# Patient Record
Sex: Male | Born: 1950
Health system: Southern US, Community
[De-identification: ages and names within clinical notes are randomized; demographics above are authoritative.]

## PROBLEM LIST (undated history)

## (undated) DIAGNOSIS — E7800 Pure hypercholesterolemia, unspecified: Secondary | ICD-10-CM

## (undated) DIAGNOSIS — I10 Essential (primary) hypertension: Secondary | ICD-10-CM

## (undated) DIAGNOSIS — I2510 Atherosclerotic heart disease of native coronary artery without angina pectoris: Secondary | ICD-10-CM

## (undated) DIAGNOSIS — I4891 Unspecified atrial fibrillation: Secondary | ICD-10-CM

## (undated) DIAGNOSIS — E119 Type 2 diabetes mellitus without complications: Secondary | ICD-10-CM

## (undated) HISTORY — PX: ABLATION OF DYSRHYTHMIC FOCUS: SHX254

## (undated) HISTORY — DX: Atherosclerotic heart disease of native coronary artery without angina pectoris: I25.10

## (undated) HISTORY — DX: Type 2 diabetes mellitus without complications: E11.9

## (undated) HISTORY — DX: Pure hypercholesterolemia, unspecified: E78.00

## (undated) HISTORY — PX: OTHER SURGICAL HISTORY: SHX169

## (undated) HISTORY — PX: CORONARY STENT PLACEMENT: SHX1402

## (undated) HISTORY — DX: Unspecified atrial fibrillation: I48.91

## (undated) HISTORY — DX: Essential (primary) hypertension: I10

---

## 2015-05-10 DIAGNOSIS — I2510 Atherosclerotic heart disease of native coronary artery without angina pectoris: Secondary | ICD-10-CM | POA: Insufficient documentation

## 2015-05-10 HISTORY — DX: Atherosclerotic heart disease of native coronary artery without angina pectoris: I25.10

## 2015-10-31 DIAGNOSIS — D510 Vitamin B12 deficiency anemia due to intrinsic factor deficiency: Secondary | ICD-10-CM | POA: Diagnosis not present

## 2015-11-09 DIAGNOSIS — E113313 Type 2 diabetes mellitus with moderate nonproliferative diabetic retinopathy with macular edema, bilateral: Secondary | ICD-10-CM | POA: Diagnosis not present

## 2015-11-28 DIAGNOSIS — D510 Vitamin B12 deficiency anemia due to intrinsic factor deficiency: Secondary | ICD-10-CM | POA: Diagnosis not present

## 2015-11-30 DIAGNOSIS — E113393 Type 2 diabetes mellitus with moderate nonproliferative diabetic retinopathy without macular edema, bilateral: Secondary | ICD-10-CM | POA: Diagnosis not present

## 2016-01-03 DIAGNOSIS — D510 Vitamin B12 deficiency anemia due to intrinsic factor deficiency: Secondary | ICD-10-CM | POA: Diagnosis not present

## 2016-01-09 DIAGNOSIS — E11610 Type 2 diabetes mellitus with diabetic neuropathic arthropathy: Secondary | ICD-10-CM | POA: Insufficient documentation

## 2016-01-09 DIAGNOSIS — E1165 Type 2 diabetes mellitus with hyperglycemia: Secondary | ICD-10-CM | POA: Diagnosis not present

## 2016-01-09 DIAGNOSIS — I10 Essential (primary) hypertension: Secondary | ICD-10-CM | POA: Insufficient documentation

## 2016-01-09 DIAGNOSIS — Z794 Long term (current) use of insulin: Secondary | ICD-10-CM

## 2016-01-09 DIAGNOSIS — E7800 Pure hypercholesterolemia, unspecified: Secondary | ICD-10-CM | POA: Diagnosis not present

## 2016-01-09 DIAGNOSIS — E1142 Type 2 diabetes mellitus with diabetic polyneuropathy: Secondary | ICD-10-CM | POA: Diagnosis not present

## 2016-01-09 DIAGNOSIS — E1065 Type 1 diabetes mellitus with hyperglycemia: Secondary | ICD-10-CM | POA: Diagnosis not present

## 2016-01-09 DIAGNOSIS — E785 Hyperlipidemia, unspecified: Secondary | ICD-10-CM

## 2016-01-09 DIAGNOSIS — E1042 Type 1 diabetes mellitus with diabetic polyneuropathy: Secondary | ICD-10-CM | POA: Diagnosis not present

## 2016-01-09 HISTORY — DX: Hyperlipidemia, unspecified: E78.5

## 2016-01-09 HISTORY — DX: Long term (current) use of insulin: Z79.4

## 2016-01-09 HISTORY — DX: Type 2 diabetes mellitus with diabetic neuropathic arthropathy: E11.610

## 2016-01-09 HISTORY — DX: Essential (primary) hypertension: I10

## 2016-01-10 DIAGNOSIS — E10610 Type 1 diabetes mellitus with diabetic neuropathic arthropathy: Secondary | ICD-10-CM | POA: Diagnosis not present

## 2016-01-10 DIAGNOSIS — M14671 Charcot's joint, right ankle and foot: Secondary | ICD-10-CM | POA: Diagnosis not present

## 2016-01-10 DIAGNOSIS — E1065 Type 1 diabetes mellitus with hyperglycemia: Secondary | ICD-10-CM | POA: Diagnosis not present

## 2016-01-10 DIAGNOSIS — Z89412 Acquired absence of left great toe: Secondary | ICD-10-CM | POA: Diagnosis not present

## 2016-01-10 DIAGNOSIS — E1049 Type 1 diabetes mellitus with other diabetic neurological complication: Secondary | ICD-10-CM | POA: Diagnosis not present

## 2016-01-10 DIAGNOSIS — M14672 Charcot's joint, left ankle and foot: Secondary | ICD-10-CM | POA: Diagnosis not present

## 2016-01-25 DIAGNOSIS — E113313 Type 2 diabetes mellitus with moderate nonproliferative diabetic retinopathy with macular edema, bilateral: Secondary | ICD-10-CM | POA: Diagnosis not present

## 2016-01-31 DIAGNOSIS — E1065 Type 1 diabetes mellitus with hyperglycemia: Secondary | ICD-10-CM | POA: Diagnosis not present

## 2016-01-31 DIAGNOSIS — E1042 Type 1 diabetes mellitus with diabetic polyneuropathy: Secondary | ICD-10-CM | POA: Diagnosis not present

## 2016-01-31 DIAGNOSIS — Z89412 Acquired absence of left great toe: Secondary | ICD-10-CM

## 2016-01-31 DIAGNOSIS — E10610 Type 1 diabetes mellitus with diabetic neuropathic arthropathy: Secondary | ICD-10-CM | POA: Diagnosis not present

## 2016-01-31 HISTORY — DX: Acquired absence of left great toe: Z89.412

## 2016-02-02 DIAGNOSIS — D510 Vitamin B12 deficiency anemia due to intrinsic factor deficiency: Secondary | ICD-10-CM | POA: Diagnosis not present

## 2016-03-26 DIAGNOSIS — D510 Vitamin B12 deficiency anemia due to intrinsic factor deficiency: Secondary | ICD-10-CM | POA: Diagnosis not present

## 2016-04-10 DIAGNOSIS — E041 Nontoxic single thyroid nodule: Secondary | ICD-10-CM | POA: Insufficient documentation

## 2016-04-10 DIAGNOSIS — E7800 Pure hypercholesterolemia, unspecified: Secondary | ICD-10-CM | POA: Diagnosis not present

## 2016-04-10 DIAGNOSIS — E1165 Type 2 diabetes mellitus with hyperglycemia: Secondary | ICD-10-CM | POA: Diagnosis not present

## 2016-04-10 DIAGNOSIS — E11610 Type 2 diabetes mellitus with diabetic neuropathic arthropathy: Secondary | ICD-10-CM | POA: Diagnosis not present

## 2016-04-10 HISTORY — DX: Nontoxic single thyroid nodule: E04.1

## 2016-05-01 DIAGNOSIS — Z89412 Acquired absence of left great toe: Secondary | ICD-10-CM | POA: Diagnosis not present

## 2016-05-01 DIAGNOSIS — E1140 Type 2 diabetes mellitus with diabetic neuropathy, unspecified: Secondary | ICD-10-CM | POA: Insufficient documentation

## 2016-05-01 DIAGNOSIS — IMO0002 Reserved for concepts with insufficient information to code with codable children: Secondary | ICD-10-CM

## 2016-05-01 DIAGNOSIS — E1165 Type 2 diabetes mellitus with hyperglycemia: Secondary | ICD-10-CM

## 2016-05-01 DIAGNOSIS — E11610 Type 2 diabetes mellitus with diabetic neuropathic arthropathy: Secondary | ICD-10-CM | POA: Diagnosis not present

## 2016-05-01 HISTORY — DX: Reserved for concepts with insufficient information to code with codable children: IMO0002

## 2016-05-01 HISTORY — DX: Type 2 diabetes mellitus with diabetic neuropathy, unspecified: E11.40

## 2016-05-07 DIAGNOSIS — D510 Vitamin B12 deficiency anemia due to intrinsic factor deficiency: Secondary | ICD-10-CM | POA: Diagnosis not present

## 2016-05-09 DIAGNOSIS — E113313 Type 2 diabetes mellitus with moderate nonproliferative diabetic retinopathy with macular edema, bilateral: Secondary | ICD-10-CM | POA: Diagnosis not present

## 2016-05-16 DIAGNOSIS — E113312 Type 2 diabetes mellitus with moderate nonproliferative diabetic retinopathy with macular edema, left eye: Secondary | ICD-10-CM | POA: Diagnosis not present

## 2016-05-20 DIAGNOSIS — Z0000 Encounter for general adult medical examination without abnormal findings: Secondary | ICD-10-CM | POA: Diagnosis not present

## 2016-05-20 DIAGNOSIS — E041 Nontoxic single thyroid nodule: Secondary | ICD-10-CM | POA: Diagnosis not present

## 2016-05-20 DIAGNOSIS — Z1389 Encounter for screening for other disorder: Secondary | ICD-10-CM | POA: Diagnosis not present

## 2016-05-20 DIAGNOSIS — E7800 Pure hypercholesterolemia, unspecified: Secondary | ICD-10-CM | POA: Diagnosis not present

## 2016-05-20 DIAGNOSIS — I10 Essential (primary) hypertension: Secondary | ICD-10-CM | POA: Diagnosis not present

## 2016-05-22 DIAGNOSIS — E041 Nontoxic single thyroid nodule: Secondary | ICD-10-CM | POA: Diagnosis not present

## 2016-06-07 DIAGNOSIS — D510 Vitamin B12 deficiency anemia due to intrinsic factor deficiency: Secondary | ICD-10-CM | POA: Diagnosis not present

## 2016-06-20 DIAGNOSIS — E113313 Type 2 diabetes mellitus with moderate nonproliferative diabetic retinopathy with macular edema, bilateral: Secondary | ICD-10-CM | POA: Diagnosis not present

## 2016-07-11 DIAGNOSIS — E1165 Type 2 diabetes mellitus with hyperglycemia: Secondary | ICD-10-CM | POA: Diagnosis not present

## 2016-07-11 DIAGNOSIS — Z794 Long term (current) use of insulin: Secondary | ICD-10-CM | POA: Diagnosis not present

## 2016-07-11 DIAGNOSIS — E1140 Type 2 diabetes mellitus with diabetic neuropathy, unspecified: Secondary | ICD-10-CM | POA: Diagnosis not present

## 2016-07-11 DIAGNOSIS — E041 Nontoxic single thyroid nodule: Secondary | ICD-10-CM | POA: Diagnosis not present

## 2016-07-11 DIAGNOSIS — I10 Essential (primary) hypertension: Secondary | ICD-10-CM | POA: Diagnosis not present

## 2016-07-12 DIAGNOSIS — D510 Vitamin B12 deficiency anemia due to intrinsic factor deficiency: Secondary | ICD-10-CM | POA: Diagnosis not present

## 2016-07-31 DIAGNOSIS — Z89412 Acquired absence of left great toe: Secondary | ICD-10-CM | POA: Diagnosis not present

## 2016-07-31 DIAGNOSIS — E11610 Type 2 diabetes mellitus with diabetic neuropathic arthropathy: Secondary | ICD-10-CM | POA: Diagnosis not present

## 2016-07-31 DIAGNOSIS — E1165 Type 2 diabetes mellitus with hyperglycemia: Secondary | ICD-10-CM | POA: Diagnosis not present

## 2016-07-31 DIAGNOSIS — E1140 Type 2 diabetes mellitus with diabetic neuropathy, unspecified: Secondary | ICD-10-CM | POA: Diagnosis not present

## 2016-08-15 DIAGNOSIS — J00 Acute nasopharyngitis [common cold]: Secondary | ICD-10-CM | POA: Diagnosis not present

## 2016-09-12 DIAGNOSIS — D510 Vitamin B12 deficiency anemia due to intrinsic factor deficiency: Secondary | ICD-10-CM | POA: Diagnosis not present

## 2016-09-12 DIAGNOSIS — E113313 Type 2 diabetes mellitus with moderate nonproliferative diabetic retinopathy with macular edema, bilateral: Secondary | ICD-10-CM | POA: Diagnosis not present

## 2016-10-10 DIAGNOSIS — D510 Vitamin B12 deficiency anemia due to intrinsic factor deficiency: Secondary | ICD-10-CM | POA: Diagnosis not present

## 2016-10-16 DIAGNOSIS — E7800 Pure hypercholesterolemia, unspecified: Secondary | ICD-10-CM | POA: Diagnosis not present

## 2016-10-16 DIAGNOSIS — E1140 Type 2 diabetes mellitus with diabetic neuropathy, unspecified: Secondary | ICD-10-CM | POA: Diagnosis not present

## 2016-10-16 DIAGNOSIS — E1165 Type 2 diabetes mellitus with hyperglycemia: Secondary | ICD-10-CM | POA: Diagnosis not present

## 2016-10-16 DIAGNOSIS — E041 Nontoxic single thyroid nodule: Secondary | ICD-10-CM | POA: Diagnosis not present

## 2016-11-04 DIAGNOSIS — E785 Hyperlipidemia, unspecified: Secondary | ICD-10-CM | POA: Diagnosis not present

## 2016-11-04 DIAGNOSIS — Z0000 Encounter for general adult medical examination without abnormal findings: Secondary | ICD-10-CM | POA: Diagnosis not present

## 2016-11-04 DIAGNOSIS — D509 Iron deficiency anemia, unspecified: Secondary | ICD-10-CM | POA: Diagnosis not present

## 2016-11-04 DIAGNOSIS — Z125 Encounter for screening for malignant neoplasm of prostate: Secondary | ICD-10-CM | POA: Diagnosis not present

## 2016-11-04 DIAGNOSIS — Z9181 History of falling: Secondary | ICD-10-CM | POA: Diagnosis not present

## 2016-11-04 DIAGNOSIS — Z79899 Other long term (current) drug therapy: Secondary | ICD-10-CM | POA: Diagnosis not present

## 2016-11-06 DIAGNOSIS — E1165 Type 2 diabetes mellitus with hyperglycemia: Secondary | ICD-10-CM | POA: Diagnosis not present

## 2016-11-06 DIAGNOSIS — E11610 Type 2 diabetes mellitus with diabetic neuropathic arthropathy: Secondary | ICD-10-CM | POA: Diagnosis not present

## 2016-11-06 DIAGNOSIS — E1140 Type 2 diabetes mellitus with diabetic neuropathy, unspecified: Secondary | ICD-10-CM | POA: Diagnosis not present

## 2016-11-06 DIAGNOSIS — Z89412 Acquired absence of left great toe: Secondary | ICD-10-CM | POA: Diagnosis not present

## 2016-11-07 DIAGNOSIS — D510 Vitamin B12 deficiency anemia due to intrinsic factor deficiency: Secondary | ICD-10-CM | POA: Diagnosis not present

## 2016-11-07 DIAGNOSIS — E113313 Type 2 diabetes mellitus with moderate nonproliferative diabetic retinopathy with macular edema, bilateral: Secondary | ICD-10-CM | POA: Diagnosis not present

## 2016-11-08 DIAGNOSIS — Z0389 Encounter for observation for other suspected diseases and conditions ruled out: Secondary | ICD-10-CM | POA: Diagnosis not present

## 2016-11-08 DIAGNOSIS — I714 Abdominal aortic aneurysm, without rupture: Secondary | ICD-10-CM | POA: Diagnosis not present

## 2016-11-11 DIAGNOSIS — J209 Acute bronchitis, unspecified: Secondary | ICD-10-CM | POA: Diagnosis not present

## 2016-11-11 DIAGNOSIS — N289 Disorder of kidney and ureter, unspecified: Secondary | ICD-10-CM | POA: Diagnosis not present

## 2016-11-14 DIAGNOSIS — E113313 Type 2 diabetes mellitus with moderate nonproliferative diabetic retinopathy with macular edema, bilateral: Secondary | ICD-10-CM | POA: Diagnosis not present

## 2016-11-19 DIAGNOSIS — I10 Essential (primary) hypertension: Secondary | ICD-10-CM | POA: Diagnosis not present

## 2016-11-19 DIAGNOSIS — E1143 Type 2 diabetes mellitus with diabetic autonomic (poly)neuropathy: Secondary | ICD-10-CM | POA: Diagnosis not present

## 2016-11-19 DIAGNOSIS — I4891 Unspecified atrial fibrillation: Secondary | ICD-10-CM | POA: Diagnosis not present

## 2016-11-26 DIAGNOSIS — I480 Paroxysmal atrial fibrillation: Secondary | ICD-10-CM | POA: Diagnosis not present

## 2016-11-26 DIAGNOSIS — E7800 Pure hypercholesterolemia, unspecified: Secondary | ICD-10-CM | POA: Diagnosis not present

## 2016-11-26 DIAGNOSIS — E0801 Diabetes mellitus due to underlying condition with hyperosmolarity with coma: Secondary | ICD-10-CM | POA: Diagnosis not present

## 2016-11-26 DIAGNOSIS — I10 Essential (primary) hypertension: Secondary | ICD-10-CM | POA: Diagnosis not present

## 2016-11-26 DIAGNOSIS — Z794 Long term (current) use of insulin: Secondary | ICD-10-CM | POA: Diagnosis not present

## 2016-11-26 DIAGNOSIS — I2510 Atherosclerotic heart disease of native coronary artery without angina pectoris: Secondary | ICD-10-CM | POA: Diagnosis not present

## 2016-12-05 DIAGNOSIS — I4891 Unspecified atrial fibrillation: Secondary | ICD-10-CM | POA: Diagnosis not present

## 2016-12-05 DIAGNOSIS — I10 Essential (primary) hypertension: Secondary | ICD-10-CM | POA: Diagnosis not present

## 2016-12-10 DIAGNOSIS — I10 Essential (primary) hypertension: Secondary | ICD-10-CM | POA: Diagnosis not present

## 2016-12-10 DIAGNOSIS — I2510 Atherosclerotic heart disease of native coronary artery without angina pectoris: Secondary | ICD-10-CM | POA: Diagnosis not present

## 2016-12-10 DIAGNOSIS — Z794 Long term (current) use of insulin: Secondary | ICD-10-CM | POA: Diagnosis not present

## 2016-12-10 DIAGNOSIS — E7800 Pure hypercholesterolemia, unspecified: Secondary | ICD-10-CM | POA: Diagnosis not present

## 2016-12-10 DIAGNOSIS — E0801 Diabetes mellitus due to underlying condition with hyperosmolarity with coma: Secondary | ICD-10-CM | POA: Diagnosis not present

## 2016-12-23 DIAGNOSIS — D510 Vitamin B12 deficiency anemia due to intrinsic factor deficiency: Secondary | ICD-10-CM | POA: Diagnosis not present

## 2016-12-25 DIAGNOSIS — I10 Essential (primary) hypertension: Secondary | ICD-10-CM | POA: Diagnosis not present

## 2016-12-25 DIAGNOSIS — E1165 Type 2 diabetes mellitus with hyperglycemia: Secondary | ICD-10-CM | POA: Diagnosis not present

## 2016-12-25 DIAGNOSIS — E7800 Pure hypercholesterolemia, unspecified: Secondary | ICD-10-CM | POA: Diagnosis not present

## 2016-12-25 DIAGNOSIS — I480 Paroxysmal atrial fibrillation: Secondary | ICD-10-CM | POA: Diagnosis not present

## 2016-12-25 DIAGNOSIS — N289 Disorder of kidney and ureter, unspecified: Secondary | ICD-10-CM | POA: Diagnosis not present

## 2016-12-25 DIAGNOSIS — I2510 Atherosclerotic heart disease of native coronary artery without angina pectoris: Secondary | ICD-10-CM | POA: Diagnosis not present

## 2016-12-26 DIAGNOSIS — E113393 Type 2 diabetes mellitus with moderate nonproliferative diabetic retinopathy without macular edema, bilateral: Secondary | ICD-10-CM | POA: Diagnosis not present

## 2017-01-13 DIAGNOSIS — W57XXXA Bitten or stung by nonvenomous insect and other nonvenomous arthropods, initial encounter: Secondary | ICD-10-CM | POA: Diagnosis not present

## 2017-01-13 DIAGNOSIS — Z6827 Body mass index (BMI) 27.0-27.9, adult: Secondary | ICD-10-CM | POA: Diagnosis not present

## 2017-01-13 DIAGNOSIS — R0609 Other forms of dyspnea: Secondary | ICD-10-CM | POA: Diagnosis not present

## 2017-01-13 DIAGNOSIS — J449 Chronic obstructive pulmonary disease, unspecified: Secondary | ICD-10-CM | POA: Diagnosis not present

## 2017-01-13 DIAGNOSIS — R21 Rash and other nonspecific skin eruption: Secondary | ICD-10-CM | POA: Diagnosis not present

## 2017-01-15 DIAGNOSIS — R0789 Other chest pain: Secondary | ICD-10-CM | POA: Diagnosis not present

## 2017-01-15 DIAGNOSIS — I471 Supraventricular tachycardia: Secondary | ICD-10-CM | POA: Diagnosis not present

## 2017-01-15 DIAGNOSIS — I249 Acute ischemic heart disease, unspecified: Secondary | ICD-10-CM | POA: Diagnosis not present

## 2017-01-15 DIAGNOSIS — I5021 Acute systolic (congestive) heart failure: Secondary | ICD-10-CM | POA: Diagnosis not present

## 2017-01-15 DIAGNOSIS — E1122 Type 2 diabetes mellitus with diabetic chronic kidney disease: Secondary | ICD-10-CM | POA: Diagnosis not present

## 2017-01-15 DIAGNOSIS — R000 Tachycardia, unspecified: Secondary | ICD-10-CM | POA: Diagnosis not present

## 2017-01-15 DIAGNOSIS — E119 Type 2 diabetes mellitus without complications: Secondary | ICD-10-CM | POA: Diagnosis not present

## 2017-01-15 DIAGNOSIS — I213 ST elevation (STEMI) myocardial infarction of unspecified site: Secondary | ICD-10-CM | POA: Diagnosis not present

## 2017-01-15 DIAGNOSIS — I480 Paroxysmal atrial fibrillation: Secondary | ICD-10-CM | POA: Diagnosis not present

## 2017-01-15 DIAGNOSIS — F17211 Nicotine dependence, cigarettes, in remission: Secondary | ICD-10-CM | POA: Diagnosis not present

## 2017-01-15 DIAGNOSIS — Z794 Long term (current) use of insulin: Secondary | ICD-10-CM | POA: Diagnosis not present

## 2017-01-15 DIAGNOSIS — D62 Acute posthemorrhagic anemia: Secondary | ICD-10-CM | POA: Diagnosis not present

## 2017-01-15 DIAGNOSIS — E11610 Type 2 diabetes mellitus with diabetic neuropathic arthropathy: Secondary | ICD-10-CM | POA: Diagnosis not present

## 2017-01-15 DIAGNOSIS — Z7982 Long term (current) use of aspirin: Secondary | ICD-10-CM | POA: Diagnosis not present

## 2017-01-15 DIAGNOSIS — I088 Other rheumatic multiple valve diseases: Secondary | ICD-10-CM | POA: Diagnosis not present

## 2017-01-15 DIAGNOSIS — R9431 Abnormal electrocardiogram [ECG] [EKG]: Secondary | ICD-10-CM | POA: Diagnosis not present

## 2017-01-15 DIAGNOSIS — R079 Chest pain, unspecified: Secondary | ICD-10-CM | POA: Diagnosis not present

## 2017-01-15 DIAGNOSIS — I130 Hypertensive heart and chronic kidney disease with heart failure and stage 1 through stage 4 chronic kidney disease, or unspecified chronic kidney disease: Secondary | ICD-10-CM | POA: Diagnosis not present

## 2017-01-15 DIAGNOSIS — I4891 Unspecified atrial fibrillation: Secondary | ICD-10-CM | POA: Diagnosis not present

## 2017-01-15 DIAGNOSIS — I9789 Other postprocedural complications and disorders of the circulatory system, not elsewhere classified: Secondary | ICD-10-CM | POA: Diagnosis not present

## 2017-01-15 DIAGNOSIS — J90 Pleural effusion, not elsewhere classified: Secondary | ICD-10-CM | POA: Diagnosis not present

## 2017-01-15 DIAGNOSIS — Z87891 Personal history of nicotine dependence: Secondary | ICD-10-CM | POA: Diagnosis not present

## 2017-01-15 DIAGNOSIS — E1140 Type 2 diabetes mellitus with diabetic neuropathy, unspecified: Secondary | ICD-10-CM | POA: Diagnosis not present

## 2017-01-15 DIAGNOSIS — I214 Non-ST elevation (NSTEMI) myocardial infarction: Secondary | ICD-10-CM | POA: Diagnosis not present

## 2017-01-15 DIAGNOSIS — J449 Chronic obstructive pulmonary disease, unspecified: Secondary | ICD-10-CM | POA: Diagnosis not present

## 2017-01-15 DIAGNOSIS — E785 Hyperlipidemia, unspecified: Secondary | ICD-10-CM | POA: Diagnosis not present

## 2017-01-15 DIAGNOSIS — I700 Atherosclerosis of aorta: Secondary | ICD-10-CM | POA: Diagnosis not present

## 2017-01-15 DIAGNOSIS — R0902 Hypoxemia: Secondary | ICD-10-CM | POA: Diagnosis not present

## 2017-01-15 DIAGNOSIS — F05 Delirium due to known physiological condition: Secondary | ICD-10-CM | POA: Diagnosis not present

## 2017-01-15 DIAGNOSIS — N390 Urinary tract infection, site not specified: Secondary | ICD-10-CM | POA: Diagnosis not present

## 2017-01-15 DIAGNOSIS — I2510 Atherosclerotic heart disease of native coronary artery without angina pectoris: Secondary | ICD-10-CM | POA: Diagnosis not present

## 2017-01-15 DIAGNOSIS — I119 Hypertensive heart disease without heart failure: Secondary | ICD-10-CM | POA: Diagnosis not present

## 2017-01-15 DIAGNOSIS — Z8249 Family history of ischemic heart disease and other diseases of the circulatory system: Secondary | ICD-10-CM | POA: Diagnosis not present

## 2017-01-15 DIAGNOSIS — I25118 Atherosclerotic heart disease of native coronary artery with other forms of angina pectoris: Secondary | ICD-10-CM | POA: Diagnosis not present

## 2017-01-15 DIAGNOSIS — Z955 Presence of coronary angioplasty implant and graft: Secondary | ICD-10-CM | POA: Diagnosis not present

## 2017-01-15 DIAGNOSIS — E7800 Pure hypercholesterolemia, unspecified: Secondary | ICD-10-CM | POA: Diagnosis not present

## 2017-01-15 DIAGNOSIS — I252 Old myocardial infarction: Secondary | ICD-10-CM | POA: Diagnosis not present

## 2017-01-15 DIAGNOSIS — N170 Acute kidney failure with tubular necrosis: Secondary | ICD-10-CM | POA: Diagnosis not present

## 2017-01-15 DIAGNOSIS — I2582 Chronic total occlusion of coronary artery: Secondary | ICD-10-CM | POA: Diagnosis not present

## 2017-01-16 DIAGNOSIS — Z87891 Personal history of nicotine dependence: Secondary | ICD-10-CM | POA: Diagnosis not present

## 2017-01-16 DIAGNOSIS — I119 Hypertensive heart disease without heart failure: Secondary | ICD-10-CM | POA: Diagnosis not present

## 2017-01-16 DIAGNOSIS — I214 Non-ST elevation (NSTEMI) myocardial infarction: Secondary | ICD-10-CM | POA: Diagnosis not present

## 2017-01-16 DIAGNOSIS — I10 Essential (primary) hypertension: Secondary | ICD-10-CM | POA: Diagnosis not present

## 2017-01-16 DIAGNOSIS — E784 Other hyperlipidemia: Secondary | ICD-10-CM | POA: Diagnosis not present

## 2017-01-16 DIAGNOSIS — I25118 Atherosclerotic heart disease of native coronary artery with other forms of angina pectoris: Secondary | ICD-10-CM | POA: Diagnosis not present

## 2017-01-16 DIAGNOSIS — I25110 Atherosclerotic heart disease of native coronary artery with unstable angina pectoris: Secondary | ICD-10-CM | POA: Diagnosis not present

## 2017-01-16 DIAGNOSIS — Z79899 Other long term (current) drug therapy: Secondary | ICD-10-CM | POA: Diagnosis not present

## 2017-01-16 DIAGNOSIS — I4891 Unspecified atrial fibrillation: Secondary | ICD-10-CM | POA: Diagnosis not present

## 2017-01-16 DIAGNOSIS — Z794 Long term (current) use of insulin: Secondary | ICD-10-CM | POA: Diagnosis not present

## 2017-01-16 DIAGNOSIS — E119 Type 2 diabetes mellitus without complications: Secondary | ICD-10-CM | POA: Diagnosis not present

## 2017-01-17 DIAGNOSIS — I214 Non-ST elevation (NSTEMI) myocardial infarction: Secondary | ICD-10-CM | POA: Diagnosis not present

## 2017-01-17 DIAGNOSIS — I10 Essential (primary) hypertension: Secondary | ICD-10-CM | POA: Diagnosis not present

## 2017-01-17 DIAGNOSIS — E119 Type 2 diabetes mellitus without complications: Secondary | ICD-10-CM | POA: Diagnosis not present

## 2017-01-17 DIAGNOSIS — I2510 Atherosclerotic heart disease of native coronary artery without angina pectoris: Secondary | ICD-10-CM | POA: Diagnosis not present

## 2017-01-17 DIAGNOSIS — I4891 Unspecified atrial fibrillation: Secondary | ICD-10-CM | POA: Diagnosis not present

## 2017-01-17 DIAGNOSIS — E785 Hyperlipidemia, unspecified: Secondary | ICD-10-CM | POA: Diagnosis not present

## 2017-01-18 DIAGNOSIS — I2510 Atherosclerotic heart disease of native coronary artery without angina pectoris: Secondary | ICD-10-CM | POA: Diagnosis not present

## 2017-01-18 DIAGNOSIS — E785 Hyperlipidemia, unspecified: Secondary | ICD-10-CM | POA: Diagnosis not present

## 2017-01-18 DIAGNOSIS — I214 Non-ST elevation (NSTEMI) myocardial infarction: Secondary | ICD-10-CM | POA: Diagnosis not present

## 2017-01-18 DIAGNOSIS — I4891 Unspecified atrial fibrillation: Secondary | ICD-10-CM | POA: Diagnosis not present

## 2017-01-18 DIAGNOSIS — I10 Essential (primary) hypertension: Secondary | ICD-10-CM | POA: Diagnosis not present

## 2017-01-18 DIAGNOSIS — E119 Type 2 diabetes mellitus without complications: Secondary | ICD-10-CM | POA: Diagnosis not present

## 2017-01-19 DIAGNOSIS — E119 Type 2 diabetes mellitus without complications: Secondary | ICD-10-CM | POA: Diagnosis not present

## 2017-01-19 DIAGNOSIS — I2510 Atherosclerotic heart disease of native coronary artery without angina pectoris: Secondary | ICD-10-CM | POA: Diagnosis not present

## 2017-01-19 DIAGNOSIS — I25118 Atherosclerotic heart disease of native coronary artery with other forms of angina pectoris: Secondary | ICD-10-CM | POA: Diagnosis not present

## 2017-01-19 DIAGNOSIS — I119 Hypertensive heart disease without heart failure: Secondary | ICD-10-CM | POA: Diagnosis not present

## 2017-01-19 DIAGNOSIS — I214 Non-ST elevation (NSTEMI) myocardial infarction: Secondary | ICD-10-CM | POA: Diagnosis not present

## 2017-01-19 DIAGNOSIS — E784 Other hyperlipidemia: Secondary | ICD-10-CM | POA: Diagnosis not present

## 2017-01-19 DIAGNOSIS — I4891 Unspecified atrial fibrillation: Secondary | ICD-10-CM | POA: Diagnosis not present

## 2017-01-19 DIAGNOSIS — Z79899 Other long term (current) drug therapy: Secondary | ICD-10-CM | POA: Diagnosis not present

## 2017-01-19 DIAGNOSIS — R001 Bradycardia, unspecified: Secondary | ICD-10-CM | POA: Diagnosis not present

## 2017-01-20 DIAGNOSIS — I480 Paroxysmal atrial fibrillation: Secondary | ICD-10-CM | POA: Diagnosis not present

## 2017-01-20 DIAGNOSIS — I5020 Unspecified systolic (congestive) heart failure: Secondary | ICD-10-CM | POA: Diagnosis not present

## 2017-01-20 DIAGNOSIS — E119 Type 2 diabetes mellitus without complications: Secondary | ICD-10-CM | POA: Diagnosis not present

## 2017-01-20 DIAGNOSIS — I110 Hypertensive heart disease with heart failure: Secondary | ICD-10-CM | POA: Diagnosis not present

## 2017-01-20 DIAGNOSIS — I2510 Atherosclerotic heart disease of native coronary artery without angina pectoris: Secondary | ICD-10-CM | POA: Diagnosis not present

## 2017-01-20 DIAGNOSIS — E784 Other hyperlipidemia: Secondary | ICD-10-CM | POA: Diagnosis not present

## 2017-01-21 DIAGNOSIS — I214 Non-ST elevation (NSTEMI) myocardial infarction: Secondary | ICD-10-CM | POA: Diagnosis not present

## 2017-01-21 DIAGNOSIS — J40 Bronchitis, not specified as acute or chronic: Secondary | ICD-10-CM | POA: Diagnosis not present

## 2017-01-21 DIAGNOSIS — I25118 Atherosclerotic heart disease of native coronary artery with other forms of angina pectoris: Secondary | ICD-10-CM | POA: Diagnosis not present

## 2017-01-21 DIAGNOSIS — I509 Heart failure, unspecified: Secondary | ICD-10-CM | POA: Diagnosis not present

## 2017-01-21 DIAGNOSIS — I110 Hypertensive heart disease with heart failure: Secondary | ICD-10-CM | POA: Diagnosis not present

## 2017-01-21 DIAGNOSIS — I371 Nonrheumatic pulmonary valve insufficiency: Secondary | ICD-10-CM | POA: Diagnosis not present

## 2017-01-21 DIAGNOSIS — I480 Paroxysmal atrial fibrillation: Secondary | ICD-10-CM | POA: Diagnosis not present

## 2017-01-21 DIAGNOSIS — E119 Type 2 diabetes mellitus without complications: Secondary | ICD-10-CM | POA: Diagnosis not present

## 2017-01-21 DIAGNOSIS — I5020 Unspecified systolic (congestive) heart failure: Secondary | ICD-10-CM | POA: Diagnosis not present

## 2017-01-21 DIAGNOSIS — E784 Other hyperlipidemia: Secondary | ICD-10-CM | POA: Diagnosis not present

## 2017-01-21 DIAGNOSIS — J90 Pleural effusion, not elsewhere classified: Secondary | ICD-10-CM | POA: Diagnosis not present

## 2017-01-21 DIAGNOSIS — I517 Cardiomegaly: Secondary | ICD-10-CM | POA: Diagnosis not present

## 2017-01-21 DIAGNOSIS — I340 Nonrheumatic mitral (valve) insufficiency: Secondary | ICD-10-CM | POA: Diagnosis not present

## 2017-01-22 DIAGNOSIS — I25110 Atherosclerotic heart disease of native coronary artery with unstable angina pectoris: Secondary | ICD-10-CM | POA: Diagnosis not present

## 2017-01-22 DIAGNOSIS — I2510 Atherosclerotic heart disease of native coronary artery without angina pectoris: Secondary | ICD-10-CM | POA: Diagnosis not present

## 2017-01-22 DIAGNOSIS — R918 Other nonspecific abnormal finding of lung field: Secondary | ICD-10-CM | POA: Diagnosis not present

## 2017-01-22 DIAGNOSIS — I214 Non-ST elevation (NSTEMI) myocardial infarction: Secondary | ICD-10-CM | POA: Diagnosis not present

## 2017-01-23 DIAGNOSIS — I25118 Atherosclerotic heart disease of native coronary artery with other forms of angina pectoris: Secondary | ICD-10-CM | POA: Diagnosis not present

## 2017-01-23 DIAGNOSIS — I10 Essential (primary) hypertension: Secondary | ICD-10-CM | POA: Diagnosis not present

## 2017-01-23 DIAGNOSIS — I25119 Atherosclerotic heart disease of native coronary artery with unspecified angina pectoris: Secondary | ICD-10-CM | POA: Diagnosis not present

## 2017-01-23 DIAGNOSIS — E784 Other hyperlipidemia: Secondary | ICD-10-CM | POA: Diagnosis not present

## 2017-01-23 DIAGNOSIS — D649 Anemia, unspecified: Secondary | ICD-10-CM | POA: Diagnosis not present

## 2017-01-23 DIAGNOSIS — I214 Non-ST elevation (NSTEMI) myocardial infarction: Secondary | ICD-10-CM | POA: Diagnosis not present

## 2017-01-23 DIAGNOSIS — N390 Urinary tract infection, site not specified: Secondary | ICD-10-CM | POA: Diagnosis not present

## 2017-01-23 DIAGNOSIS — Z951 Presence of aortocoronary bypass graft: Secondary | ICD-10-CM | POA: Diagnosis not present

## 2017-01-23 DIAGNOSIS — E119 Type 2 diabetes mellitus without complications: Secondary | ICD-10-CM | POA: Diagnosis not present

## 2017-01-23 DIAGNOSIS — I119 Hypertensive heart disease without heart failure: Secondary | ICD-10-CM | POA: Diagnosis not present

## 2017-01-23 DIAGNOSIS — R918 Other nonspecific abnormal finding of lung field: Secondary | ICD-10-CM | POA: Diagnosis not present

## 2017-01-23 DIAGNOSIS — N179 Acute kidney failure, unspecified: Secondary | ICD-10-CM | POA: Diagnosis not present

## 2017-01-23 DIAGNOSIS — I4891 Unspecified atrial fibrillation: Secondary | ICD-10-CM | POA: Diagnosis not present

## 2017-01-23 DIAGNOSIS — E1169 Type 2 diabetes mellitus with other specified complication: Secondary | ICD-10-CM | POA: Diagnosis not present

## 2017-01-24 DIAGNOSIS — D649 Anemia, unspecified: Secondary | ICD-10-CM | POA: Diagnosis not present

## 2017-01-24 DIAGNOSIS — J9811 Atelectasis: Secondary | ICD-10-CM | POA: Diagnosis not present

## 2017-01-24 DIAGNOSIS — I10 Essential (primary) hypertension: Secondary | ICD-10-CM | POA: Diagnosis not present

## 2017-01-24 DIAGNOSIS — Z79899 Other long term (current) drug therapy: Secondary | ICD-10-CM | POA: Diagnosis not present

## 2017-01-24 DIAGNOSIS — I25119 Atherosclerotic heart disease of native coronary artery with unspecified angina pectoris: Secondary | ICD-10-CM | POA: Diagnosis not present

## 2017-01-24 DIAGNOSIS — E784 Other hyperlipidemia: Secondary | ICD-10-CM | POA: Diagnosis not present

## 2017-01-24 DIAGNOSIS — E119 Type 2 diabetes mellitus without complications: Secondary | ICD-10-CM | POA: Diagnosis not present

## 2017-01-24 DIAGNOSIS — E1169 Type 2 diabetes mellitus with other specified complication: Secondary | ICD-10-CM | POA: Diagnosis not present

## 2017-01-24 DIAGNOSIS — I119 Hypertensive heart disease without heart failure: Secondary | ICD-10-CM | POA: Diagnosis not present

## 2017-01-24 DIAGNOSIS — I214 Non-ST elevation (NSTEMI) myocardial infarction: Secondary | ICD-10-CM | POA: Diagnosis not present

## 2017-01-24 DIAGNOSIS — I4891 Unspecified atrial fibrillation: Secondary | ICD-10-CM | POA: Diagnosis not present

## 2017-01-24 DIAGNOSIS — I25708 Atherosclerosis of coronary artery bypass graft(s), unspecified, with other forms of angina pectoris: Secondary | ICD-10-CM | POA: Diagnosis not present

## 2017-01-24 DIAGNOSIS — N179 Acute kidney failure, unspecified: Secondary | ICD-10-CM | POA: Diagnosis not present

## 2017-01-24 DIAGNOSIS — N390 Urinary tract infection, site not specified: Secondary | ICD-10-CM | POA: Diagnosis not present

## 2017-01-25 DIAGNOSIS — I214 Non-ST elevation (NSTEMI) myocardial infarction: Secondary | ICD-10-CM | POA: Diagnosis not present

## 2017-01-25 DIAGNOSIS — I4891 Unspecified atrial fibrillation: Secondary | ICD-10-CM | POA: Diagnosis not present

## 2017-01-25 DIAGNOSIS — N390 Urinary tract infection, site not specified: Secondary | ICD-10-CM | POA: Diagnosis not present

## 2017-01-25 DIAGNOSIS — I10 Essential (primary) hypertension: Secondary | ICD-10-CM | POA: Diagnosis not present

## 2017-01-25 DIAGNOSIS — I25118 Atherosclerotic heart disease of native coronary artery with other forms of angina pectoris: Secondary | ICD-10-CM | POA: Diagnosis not present

## 2017-01-25 DIAGNOSIS — N179 Acute kidney failure, unspecified: Secondary | ICD-10-CM | POA: Diagnosis not present

## 2017-01-25 DIAGNOSIS — I119 Hypertensive heart disease without heart failure: Secondary | ICD-10-CM | POA: Diagnosis not present

## 2017-01-25 DIAGNOSIS — Z79899 Other long term (current) drug therapy: Secondary | ICD-10-CM | POA: Diagnosis not present

## 2017-01-25 DIAGNOSIS — E119 Type 2 diabetes mellitus without complications: Secondary | ICD-10-CM | POA: Diagnosis not present

## 2017-01-25 DIAGNOSIS — R918 Other nonspecific abnormal finding of lung field: Secondary | ICD-10-CM | POA: Diagnosis not present

## 2017-01-25 DIAGNOSIS — I25119 Atherosclerotic heart disease of native coronary artery with unspecified angina pectoris: Secondary | ICD-10-CM | POA: Diagnosis not present

## 2017-01-25 DIAGNOSIS — D649 Anemia, unspecified: Secondary | ICD-10-CM | POA: Diagnosis not present

## 2017-01-25 DIAGNOSIS — I481 Persistent atrial fibrillation: Secondary | ICD-10-CM | POA: Diagnosis not present

## 2017-01-25 DIAGNOSIS — E784 Other hyperlipidemia: Secondary | ICD-10-CM | POA: Diagnosis not present

## 2017-01-25 DIAGNOSIS — E1169 Type 2 diabetes mellitus with other specified complication: Secondary | ICD-10-CM | POA: Diagnosis not present

## 2017-01-25 DIAGNOSIS — I517 Cardiomegaly: Secondary | ICD-10-CM | POA: Diagnosis not present

## 2017-01-26 DIAGNOSIS — E1169 Type 2 diabetes mellitus with other specified complication: Secondary | ICD-10-CM | POA: Diagnosis not present

## 2017-01-26 DIAGNOSIS — G4751 Confusional arousals: Secondary | ICD-10-CM | POA: Diagnosis not present

## 2017-01-26 DIAGNOSIS — E784 Other hyperlipidemia: Secondary | ICD-10-CM | POA: Diagnosis not present

## 2017-01-26 DIAGNOSIS — I119 Hypertensive heart disease without heart failure: Secondary | ICD-10-CM | POA: Diagnosis not present

## 2017-01-26 DIAGNOSIS — N179 Acute kidney failure, unspecified: Secondary | ICD-10-CM | POA: Diagnosis not present

## 2017-01-26 DIAGNOSIS — J90 Pleural effusion, not elsewhere classified: Secondary | ICD-10-CM | POA: Diagnosis not present

## 2017-01-26 DIAGNOSIS — I10 Essential (primary) hypertension: Secondary | ICD-10-CM | POA: Diagnosis not present

## 2017-01-26 DIAGNOSIS — I25708 Atherosclerosis of coronary artery bypass graft(s), unspecified, with other forms of angina pectoris: Secondary | ICD-10-CM | POA: Diagnosis not present

## 2017-01-26 DIAGNOSIS — N390 Urinary tract infection, site not specified: Secondary | ICD-10-CM | POA: Diagnosis not present

## 2017-01-26 DIAGNOSIS — D649 Anemia, unspecified: Secondary | ICD-10-CM | POA: Diagnosis not present

## 2017-01-26 DIAGNOSIS — I4891 Unspecified atrial fibrillation: Secondary | ICD-10-CM | POA: Diagnosis not present

## 2017-01-26 DIAGNOSIS — R4182 Altered mental status, unspecified: Secondary | ICD-10-CM | POA: Diagnosis not present

## 2017-01-26 DIAGNOSIS — I214 Non-ST elevation (NSTEMI) myocardial infarction: Secondary | ICD-10-CM | POA: Diagnosis not present

## 2017-01-26 DIAGNOSIS — E119 Type 2 diabetes mellitus without complications: Secondary | ICD-10-CM | POA: Diagnosis not present

## 2017-01-26 DIAGNOSIS — I480 Paroxysmal atrial fibrillation: Secondary | ICD-10-CM | POA: Diagnosis not present

## 2017-01-26 DIAGNOSIS — I25119 Atherosclerotic heart disease of native coronary artery with unspecified angina pectoris: Secondary | ICD-10-CM | POA: Diagnosis not present

## 2017-01-27 DIAGNOSIS — G4751 Confusional arousals: Secondary | ICD-10-CM | POA: Diagnosis not present

## 2017-01-27 DIAGNOSIS — I25708 Atherosclerosis of coronary artery bypass graft(s), unspecified, with other forms of angina pectoris: Secondary | ICD-10-CM | POA: Diagnosis not present

## 2017-01-27 DIAGNOSIS — E1169 Type 2 diabetes mellitus with other specified complication: Secondary | ICD-10-CM | POA: Diagnosis not present

## 2017-01-27 DIAGNOSIS — I4891 Unspecified atrial fibrillation: Secondary | ICD-10-CM | POA: Diagnosis not present

## 2017-01-27 DIAGNOSIS — Z79899 Other long term (current) drug therapy: Secondary | ICD-10-CM | POA: Diagnosis not present

## 2017-01-27 DIAGNOSIS — R0600 Dyspnea, unspecified: Secondary | ICD-10-CM | POA: Diagnosis not present

## 2017-01-27 DIAGNOSIS — N179 Acute kidney failure, unspecified: Secondary | ICD-10-CM | POA: Diagnosis not present

## 2017-01-27 DIAGNOSIS — E784 Other hyperlipidemia: Secondary | ICD-10-CM | POA: Diagnosis not present

## 2017-01-27 DIAGNOSIS — I119 Hypertensive heart disease without heart failure: Secondary | ICD-10-CM | POA: Diagnosis not present

## 2017-01-27 DIAGNOSIS — Z452 Encounter for adjustment and management of vascular access device: Secondary | ICD-10-CM | POA: Diagnosis not present

## 2017-01-27 DIAGNOSIS — I25119 Atherosclerotic heart disease of native coronary artery with unspecified angina pectoris: Secondary | ICD-10-CM | POA: Diagnosis not present

## 2017-01-27 DIAGNOSIS — I10 Essential (primary) hypertension: Secondary | ICD-10-CM | POA: Diagnosis not present

## 2017-01-27 DIAGNOSIS — I214 Non-ST elevation (NSTEMI) myocardial infarction: Secondary | ICD-10-CM | POA: Diagnosis not present

## 2017-01-27 DIAGNOSIS — E119 Type 2 diabetes mellitus without complications: Secondary | ICD-10-CM | POA: Diagnosis not present

## 2017-01-28 DIAGNOSIS — I214 Non-ST elevation (NSTEMI) myocardial infarction: Secondary | ICD-10-CM | POA: Diagnosis not present

## 2017-01-28 DIAGNOSIS — N179 Acute kidney failure, unspecified: Secondary | ICD-10-CM | POA: Diagnosis not present

## 2017-01-28 DIAGNOSIS — I25119 Atherosclerotic heart disease of native coronary artery with unspecified angina pectoris: Secondary | ICD-10-CM | POA: Diagnosis not present

## 2017-01-28 DIAGNOSIS — I10 Essential (primary) hypertension: Secondary | ICD-10-CM | POA: Diagnosis not present

## 2017-01-28 DIAGNOSIS — I110 Hypertensive heart disease with heart failure: Secondary | ICD-10-CM | POA: Diagnosis not present

## 2017-01-28 DIAGNOSIS — I25708 Atherosclerosis of coronary artery bypass graft(s), unspecified, with other forms of angina pectoris: Secondary | ICD-10-CM | POA: Diagnosis not present

## 2017-01-28 DIAGNOSIS — I480 Paroxysmal atrial fibrillation: Secondary | ICD-10-CM | POA: Diagnosis not present

## 2017-01-28 DIAGNOSIS — I5021 Acute systolic (congestive) heart failure: Secondary | ICD-10-CM | POA: Diagnosis not present

## 2017-01-28 DIAGNOSIS — E784 Other hyperlipidemia: Secondary | ICD-10-CM | POA: Diagnosis not present

## 2017-01-28 DIAGNOSIS — E119 Type 2 diabetes mellitus without complications: Secondary | ICD-10-CM | POA: Diagnosis not present

## 2017-01-28 DIAGNOSIS — E8779 Other fluid overload: Secondary | ICD-10-CM | POA: Diagnosis not present

## 2017-01-28 DIAGNOSIS — Z7901 Long term (current) use of anticoagulants: Secondary | ICD-10-CM | POA: Diagnosis not present

## 2017-01-29 DIAGNOSIS — I214 Non-ST elevation (NSTEMI) myocardial infarction: Secondary | ICD-10-CM | POA: Diagnosis not present

## 2017-01-29 DIAGNOSIS — N179 Acute kidney failure, unspecified: Secondary | ICD-10-CM | POA: Diagnosis not present

## 2017-01-29 DIAGNOSIS — J9811 Atelectasis: Secondary | ICD-10-CM | POA: Diagnosis not present

## 2017-01-29 DIAGNOSIS — E8779 Other fluid overload: Secondary | ICD-10-CM | POA: Diagnosis not present

## 2017-01-29 DIAGNOSIS — I25119 Atherosclerotic heart disease of native coronary artery with unspecified angina pectoris: Secondary | ICD-10-CM | POA: Diagnosis not present

## 2017-01-29 DIAGNOSIS — J90 Pleural effusion, not elsewhere classified: Secondary | ICD-10-CM | POA: Diagnosis not present

## 2017-01-29 DIAGNOSIS — I4891 Unspecified atrial fibrillation: Secondary | ICD-10-CM | POA: Diagnosis not present

## 2017-01-29 DIAGNOSIS — I2510 Atherosclerotic heart disease of native coronary artery without angina pectoris: Secondary | ICD-10-CM | POA: Diagnosis not present

## 2017-01-29 DIAGNOSIS — I10 Essential (primary) hypertension: Secondary | ICD-10-CM | POA: Diagnosis not present

## 2017-01-29 DIAGNOSIS — Z951 Presence of aortocoronary bypass graft: Secondary | ICD-10-CM | POA: Diagnosis not present

## 2017-01-30 DIAGNOSIS — N179 Acute kidney failure, unspecified: Secondary | ICD-10-CM | POA: Diagnosis not present

## 2017-01-30 DIAGNOSIS — I25119 Atherosclerotic heart disease of native coronary artery with unspecified angina pectoris: Secondary | ICD-10-CM | POA: Diagnosis not present

## 2017-01-30 DIAGNOSIS — I480 Paroxysmal atrial fibrillation: Secondary | ICD-10-CM | POA: Diagnosis not present

## 2017-01-30 DIAGNOSIS — I10 Essential (primary) hypertension: Secondary | ICD-10-CM | POA: Diagnosis not present

## 2017-01-30 DIAGNOSIS — I214 Non-ST elevation (NSTEMI) myocardial infarction: Secondary | ICD-10-CM | POA: Diagnosis not present

## 2017-01-30 DIAGNOSIS — I2510 Atherosclerotic heart disease of native coronary artery without angina pectoris: Secondary | ICD-10-CM | POA: Diagnosis not present

## 2017-01-30 DIAGNOSIS — J9811 Atelectasis: Secondary | ICD-10-CM | POA: Diagnosis not present

## 2017-01-30 DIAGNOSIS — E8779 Other fluid overload: Secondary | ICD-10-CM | POA: Diagnosis not present

## 2017-01-30 DIAGNOSIS — J90 Pleural effusion, not elsewhere classified: Secondary | ICD-10-CM | POA: Diagnosis not present

## 2017-01-30 DIAGNOSIS — Z951 Presence of aortocoronary bypass graft: Secondary | ICD-10-CM | POA: Diagnosis not present

## 2017-01-31 DIAGNOSIS — Z951 Presence of aortocoronary bypass graft: Secondary | ICD-10-CM | POA: Diagnosis not present

## 2017-01-31 DIAGNOSIS — I2510 Atherosclerotic heart disease of native coronary artery without angina pectoris: Secondary | ICD-10-CM | POA: Diagnosis not present

## 2017-01-31 DIAGNOSIS — I480 Paroxysmal atrial fibrillation: Secondary | ICD-10-CM | POA: Diagnosis not present

## 2017-01-31 DIAGNOSIS — I214 Non-ST elevation (NSTEMI) myocardial infarction: Secondary | ICD-10-CM | POA: Diagnosis not present

## 2017-02-01 DIAGNOSIS — I214 Non-ST elevation (NSTEMI) myocardial infarction: Secondary | ICD-10-CM | POA: Diagnosis not present

## 2017-02-01 DIAGNOSIS — J90 Pleural effusion, not elsewhere classified: Secondary | ICD-10-CM | POA: Diagnosis not present

## 2017-02-01 DIAGNOSIS — E784 Other hyperlipidemia: Secondary | ICD-10-CM | POA: Diagnosis not present

## 2017-02-01 DIAGNOSIS — I1310 Hypertensive heart and chronic kidney disease without heart failure, with stage 1 through stage 4 chronic kidney disease, or unspecified chronic kidney disease: Secondary | ICD-10-CM | POA: Diagnosis not present

## 2017-02-01 DIAGNOSIS — N183 Chronic kidney disease, stage 3 (moderate): Secondary | ICD-10-CM | POA: Diagnosis not present

## 2017-02-01 DIAGNOSIS — I25708 Atherosclerosis of coronary artery bypass graft(s), unspecified, with other forms of angina pectoris: Secondary | ICD-10-CM | POA: Diagnosis not present

## 2017-02-01 DIAGNOSIS — I480 Paroxysmal atrial fibrillation: Secondary | ICD-10-CM | POA: Diagnosis not present

## 2017-02-02 DIAGNOSIS — I214 Non-ST elevation (NSTEMI) myocardial infarction: Secondary | ICD-10-CM | POA: Diagnosis not present

## 2017-02-02 DIAGNOSIS — N183 Chronic kidney disease, stage 3 (moderate): Secondary | ICD-10-CM | POA: Diagnosis not present

## 2017-02-02 DIAGNOSIS — Z6828 Body mass index (BMI) 28.0-28.9, adult: Secondary | ICD-10-CM | POA: Diagnosis not present

## 2017-02-02 DIAGNOSIS — J9811 Atelectasis: Secondary | ICD-10-CM | POA: Diagnosis not present

## 2017-02-02 DIAGNOSIS — I1310 Hypertensive heart and chronic kidney disease without heart failure, with stage 1 through stage 4 chronic kidney disease, or unspecified chronic kidney disease: Secondary | ICD-10-CM | POA: Diagnosis not present

## 2017-02-02 DIAGNOSIS — J90 Pleural effusion, not elsewhere classified: Secondary | ICD-10-CM | POA: Diagnosis not present

## 2017-02-02 DIAGNOSIS — E784 Other hyperlipidemia: Secondary | ICD-10-CM | POA: Diagnosis not present

## 2017-02-02 DIAGNOSIS — I25708 Atherosclerosis of coronary artery bypass graft(s), unspecified, with other forms of angina pectoris: Secondary | ICD-10-CM | POA: Diagnosis not present

## 2017-02-02 DIAGNOSIS — I480 Paroxysmal atrial fibrillation: Secondary | ICD-10-CM | POA: Diagnosis not present

## 2017-02-03 DIAGNOSIS — Z23 Encounter for immunization: Secondary | ICD-10-CM | POA: Diagnosis not present

## 2017-02-03 DIAGNOSIS — J90 Pleural effusion, not elsewhere classified: Secondary | ICD-10-CM | POA: Diagnosis not present

## 2017-02-03 DIAGNOSIS — I119 Hypertensive heart disease without heart failure: Secondary | ICD-10-CM | POA: Diagnosis not present

## 2017-02-03 DIAGNOSIS — J449 Chronic obstructive pulmonary disease, unspecified: Secondary | ICD-10-CM | POA: Diagnosis not present

## 2017-02-03 DIAGNOSIS — D62 Acute posthemorrhagic anemia: Secondary | ICD-10-CM | POA: Diagnosis not present

## 2017-02-03 DIAGNOSIS — N189 Chronic kidney disease, unspecified: Secondary | ICD-10-CM | POA: Diagnosis not present

## 2017-02-03 DIAGNOSIS — E1140 Type 2 diabetes mellitus with diabetic neuropathy, unspecified: Secondary | ICD-10-CM | POA: Diagnosis not present

## 2017-02-03 DIAGNOSIS — I480 Paroxysmal atrial fibrillation: Secondary | ICD-10-CM | POA: Diagnosis not present

## 2017-02-03 DIAGNOSIS — I214 Non-ST elevation (NSTEMI) myocardial infarction: Secondary | ICD-10-CM | POA: Diagnosis not present

## 2017-02-03 DIAGNOSIS — E876 Hypokalemia: Secondary | ICD-10-CM | POA: Diagnosis not present

## 2017-02-03 DIAGNOSIS — N179 Acute kidney failure, unspecified: Secondary | ICD-10-CM | POA: Diagnosis not present

## 2017-02-03 DIAGNOSIS — E785 Hyperlipidemia, unspecified: Secondary | ICD-10-CM | POA: Diagnosis not present

## 2017-02-03 DIAGNOSIS — Z955 Presence of coronary angioplasty implant and graft: Secondary | ICD-10-CM | POA: Diagnosis not present

## 2017-02-03 DIAGNOSIS — E784 Other hyperlipidemia: Secondary | ICD-10-CM | POA: Diagnosis not present

## 2017-02-03 DIAGNOSIS — E119 Type 2 diabetes mellitus without complications: Secondary | ICD-10-CM | POA: Diagnosis not present

## 2017-02-03 DIAGNOSIS — Z951 Presence of aortocoronary bypass graft: Secondary | ICD-10-CM | POA: Diagnosis not present

## 2017-02-03 DIAGNOSIS — E7800 Pure hypercholesterolemia, unspecified: Secondary | ICD-10-CM | POA: Diagnosis not present

## 2017-02-03 DIAGNOSIS — Z7901 Long term (current) use of anticoagulants: Secondary | ICD-10-CM | POA: Diagnosis not present

## 2017-02-03 DIAGNOSIS — F05 Delirium due to known physiological condition: Secondary | ICD-10-CM | POA: Diagnosis not present

## 2017-02-03 DIAGNOSIS — I2510 Atherosclerotic heart disease of native coronary artery without angina pectoris: Secondary | ICD-10-CM | POA: Diagnosis not present

## 2017-02-03 DIAGNOSIS — I10 Essential (primary) hypertension: Secondary | ICD-10-CM | POA: Diagnosis not present

## 2017-02-03 DIAGNOSIS — I25708 Atherosclerosis of coronary artery bypass graft(s), unspecified, with other forms of angina pectoris: Secondary | ICD-10-CM | POA: Diagnosis not present

## 2017-02-03 DIAGNOSIS — Z7409 Other reduced mobility: Secondary | ICD-10-CM | POA: Diagnosis not present

## 2017-02-03 DIAGNOSIS — Z48812 Encounter for surgical aftercare following surgery on the circulatory system: Secondary | ICD-10-CM | POA: Diagnosis not present

## 2017-02-04 DIAGNOSIS — Z7409 Other reduced mobility: Secondary | ICD-10-CM | POA: Diagnosis not present

## 2017-02-04 DIAGNOSIS — D62 Acute posthemorrhagic anemia: Secondary | ICD-10-CM | POA: Diagnosis not present

## 2017-02-04 DIAGNOSIS — I2510 Atherosclerotic heart disease of native coronary artery without angina pectoris: Secondary | ICD-10-CM | POA: Diagnosis not present

## 2017-02-04 DIAGNOSIS — I10 Essential (primary) hypertension: Secondary | ICD-10-CM | POA: Diagnosis not present

## 2017-02-04 DIAGNOSIS — F05 Delirium due to known physiological condition: Secondary | ICD-10-CM | POA: Diagnosis not present

## 2017-02-04 DIAGNOSIS — N179 Acute kidney failure, unspecified: Secondary | ICD-10-CM | POA: Diagnosis not present

## 2017-02-04 DIAGNOSIS — E119 Type 2 diabetes mellitus without complications: Secondary | ICD-10-CM | POA: Diagnosis not present

## 2017-02-04 DIAGNOSIS — I214 Non-ST elevation (NSTEMI) myocardial infarction: Secondary | ICD-10-CM | POA: Diagnosis not present

## 2017-02-04 DIAGNOSIS — E7800 Pure hypercholesterolemia, unspecified: Secondary | ICD-10-CM | POA: Diagnosis not present

## 2017-02-04 DIAGNOSIS — I480 Paroxysmal atrial fibrillation: Secondary | ICD-10-CM | POA: Diagnosis not present

## 2017-02-05 DIAGNOSIS — I2510 Atherosclerotic heart disease of native coronary artery without angina pectoris: Secondary | ICD-10-CM | POA: Diagnosis not present

## 2017-02-05 DIAGNOSIS — E7800 Pure hypercholesterolemia, unspecified: Secondary | ICD-10-CM | POA: Diagnosis not present

## 2017-02-05 DIAGNOSIS — I480 Paroxysmal atrial fibrillation: Secondary | ICD-10-CM | POA: Diagnosis not present

## 2017-02-05 DIAGNOSIS — D62 Acute posthemorrhagic anemia: Secondary | ICD-10-CM | POA: Diagnosis not present

## 2017-02-05 DIAGNOSIS — N179 Acute kidney failure, unspecified: Secondary | ICD-10-CM | POA: Diagnosis not present

## 2017-02-05 DIAGNOSIS — E119 Type 2 diabetes mellitus without complications: Secondary | ICD-10-CM | POA: Diagnosis not present

## 2017-02-05 DIAGNOSIS — F05 Delirium due to known physiological condition: Secondary | ICD-10-CM | POA: Diagnosis not present

## 2017-02-05 DIAGNOSIS — Z7409 Other reduced mobility: Secondary | ICD-10-CM | POA: Diagnosis not present

## 2017-02-05 DIAGNOSIS — I214 Non-ST elevation (NSTEMI) myocardial infarction: Secondary | ICD-10-CM | POA: Diagnosis not present

## 2017-02-05 DIAGNOSIS — I10 Essential (primary) hypertension: Secondary | ICD-10-CM | POA: Diagnosis not present

## 2017-02-06 DIAGNOSIS — I214 Non-ST elevation (NSTEMI) myocardial infarction: Secondary | ICD-10-CM | POA: Diagnosis not present

## 2017-02-06 DIAGNOSIS — I2510 Atherosclerotic heart disease of native coronary artery without angina pectoris: Secondary | ICD-10-CM | POA: Diagnosis not present

## 2017-02-06 DIAGNOSIS — D62 Acute posthemorrhagic anemia: Secondary | ICD-10-CM | POA: Diagnosis not present

## 2017-02-06 DIAGNOSIS — Z7409 Other reduced mobility: Secondary | ICD-10-CM | POA: Diagnosis not present

## 2017-02-06 DIAGNOSIS — F05 Delirium due to known physiological condition: Secondary | ICD-10-CM | POA: Diagnosis not present

## 2017-02-06 DIAGNOSIS — I10 Essential (primary) hypertension: Secondary | ICD-10-CM | POA: Diagnosis not present

## 2017-02-06 DIAGNOSIS — Z955 Presence of coronary angioplasty implant and graft: Secondary | ICD-10-CM | POA: Diagnosis not present

## 2017-02-06 DIAGNOSIS — I480 Paroxysmal atrial fibrillation: Secondary | ICD-10-CM | POA: Diagnosis not present

## 2017-02-06 DIAGNOSIS — E785 Hyperlipidemia, unspecified: Secondary | ICD-10-CM | POA: Diagnosis not present

## 2017-02-06 DIAGNOSIS — E7800 Pure hypercholesterolemia, unspecified: Secondary | ICD-10-CM | POA: Diagnosis not present

## 2017-02-06 DIAGNOSIS — N179 Acute kidney failure, unspecified: Secondary | ICD-10-CM | POA: Diagnosis not present

## 2017-02-06 DIAGNOSIS — E119 Type 2 diabetes mellitus without complications: Secondary | ICD-10-CM | POA: Diagnosis not present

## 2017-02-07 DIAGNOSIS — E785 Hyperlipidemia, unspecified: Secondary | ICD-10-CM | POA: Diagnosis not present

## 2017-02-07 DIAGNOSIS — Z955 Presence of coronary angioplasty implant and graft: Secondary | ICD-10-CM | POA: Diagnosis not present

## 2017-02-07 DIAGNOSIS — I10 Essential (primary) hypertension: Secondary | ICD-10-CM | POA: Diagnosis not present

## 2017-02-07 DIAGNOSIS — F05 Delirium due to known physiological condition: Secondary | ICD-10-CM | POA: Diagnosis not present

## 2017-02-07 DIAGNOSIS — E7800 Pure hypercholesterolemia, unspecified: Secondary | ICD-10-CM | POA: Diagnosis not present

## 2017-02-07 DIAGNOSIS — E119 Type 2 diabetes mellitus without complications: Secondary | ICD-10-CM | POA: Diagnosis not present

## 2017-02-07 DIAGNOSIS — D62 Acute posthemorrhagic anemia: Secondary | ICD-10-CM | POA: Diagnosis not present

## 2017-02-07 DIAGNOSIS — I214 Non-ST elevation (NSTEMI) myocardial infarction: Secondary | ICD-10-CM | POA: Diagnosis not present

## 2017-02-07 DIAGNOSIS — Z7409 Other reduced mobility: Secondary | ICD-10-CM | POA: Diagnosis not present

## 2017-02-07 DIAGNOSIS — I480 Paroxysmal atrial fibrillation: Secondary | ICD-10-CM | POA: Diagnosis not present

## 2017-02-07 DIAGNOSIS — I2510 Atherosclerotic heart disease of native coronary artery without angina pectoris: Secondary | ICD-10-CM | POA: Diagnosis not present

## 2017-02-07 DIAGNOSIS — N179 Acute kidney failure, unspecified: Secondary | ICD-10-CM | POA: Diagnosis not present

## 2017-02-14 DIAGNOSIS — N189 Chronic kidney disease, unspecified: Secondary | ICD-10-CM | POA: Diagnosis not present

## 2017-02-14 DIAGNOSIS — Z6826 Body mass index (BMI) 26.0-26.9, adult: Secondary | ICD-10-CM | POA: Diagnosis not present

## 2017-02-14 DIAGNOSIS — I259 Chronic ischemic heart disease, unspecified: Secondary | ICD-10-CM | POA: Diagnosis not present

## 2017-02-14 DIAGNOSIS — I10 Essential (primary) hypertension: Secondary | ICD-10-CM | POA: Diagnosis not present

## 2017-02-14 DIAGNOSIS — R600 Localized edema: Secondary | ICD-10-CM | POA: Diagnosis not present

## 2017-02-14 DIAGNOSIS — E7800 Pure hypercholesterolemia, unspecified: Secondary | ICD-10-CM | POA: Diagnosis not present

## 2017-02-21 DIAGNOSIS — I10 Essential (primary) hypertension: Secondary | ICD-10-CM | POA: Diagnosis not present

## 2017-02-21 DIAGNOSIS — I2510 Atherosclerotic heart disease of native coronary artery without angina pectoris: Secondary | ICD-10-CM | POA: Diagnosis not present

## 2017-02-21 DIAGNOSIS — E7800 Pure hypercholesterolemia, unspecified: Secondary | ICD-10-CM | POA: Diagnosis not present

## 2017-02-21 DIAGNOSIS — Z794 Long term (current) use of insulin: Secondary | ICD-10-CM | POA: Diagnosis not present

## 2017-02-21 DIAGNOSIS — E0801 Diabetes mellitus due to underlying condition with hyperosmolarity with coma: Secondary | ICD-10-CM | POA: Diagnosis not present

## 2017-02-21 DIAGNOSIS — I480 Paroxysmal atrial fibrillation: Secondary | ICD-10-CM | POA: Diagnosis not present

## 2017-03-09 DIAGNOSIS — H66001 Acute suppurative otitis media without spontaneous rupture of ear drum, right ear: Secondary | ICD-10-CM | POA: Diagnosis not present

## 2017-03-09 DIAGNOSIS — J0100 Acute maxillary sinusitis, unspecified: Secondary | ICD-10-CM | POA: Diagnosis not present

## 2017-03-13 DIAGNOSIS — E113393 Type 2 diabetes mellitus with moderate nonproliferative diabetic retinopathy without macular edema, bilateral: Secondary | ICD-10-CM | POA: Diagnosis not present

## 2017-03-20 DIAGNOSIS — R05 Cough: Secondary | ICD-10-CM | POA: Diagnosis not present

## 2017-03-20 DIAGNOSIS — H66001 Acute suppurative otitis media without spontaneous rupture of ear drum, right ear: Secondary | ICD-10-CM | POA: Diagnosis not present

## 2017-03-20 DIAGNOSIS — J0100 Acute maxillary sinusitis, unspecified: Secondary | ICD-10-CM | POA: Diagnosis not present

## 2017-06-19 DIAGNOSIS — E113393 Type 2 diabetes mellitus with moderate nonproliferative diabetic retinopathy without macular edema, bilateral: Secondary | ICD-10-CM | POA: Diagnosis not present

## 2017-07-03 DIAGNOSIS — I10 Essential (primary) hypertension: Secondary | ICD-10-CM | POA: Diagnosis not present

## 2017-07-03 DIAGNOSIS — Z794 Long term (current) use of insulin: Secondary | ICD-10-CM | POA: Diagnosis not present

## 2017-07-03 DIAGNOSIS — E041 Nontoxic single thyroid nodule: Secondary | ICD-10-CM | POA: Diagnosis not present

## 2017-07-03 DIAGNOSIS — E1140 Type 2 diabetes mellitus with diabetic neuropathy, unspecified: Secondary | ICD-10-CM | POA: Diagnosis not present

## 2017-07-11 DIAGNOSIS — E041 Nontoxic single thyroid nodule: Secondary | ICD-10-CM | POA: Diagnosis not present

## 2017-07-23 ENCOUNTER — Ambulatory Visit (INDEPENDENT_AMBULATORY_CARE_PROVIDER_SITE_OTHER): Payer: PPO | Admitting: Cardiology

## 2017-07-23 ENCOUNTER — Encounter: Payer: Self-pay | Admitting: Cardiology

## 2017-07-23 VITALS — BP 144/80 | HR 55 | Ht 70.0 in | Wt 197.4 lb

## 2017-07-23 DIAGNOSIS — Z794 Long term (current) use of insulin: Secondary | ICD-10-CM | POA: Diagnosis not present

## 2017-07-23 DIAGNOSIS — E782 Mixed hyperlipidemia: Secondary | ICD-10-CM | POA: Diagnosis not present

## 2017-07-23 DIAGNOSIS — I2510 Atherosclerotic heart disease of native coronary artery without angina pectoris: Secondary | ICD-10-CM

## 2017-07-23 DIAGNOSIS — IMO0002 Reserved for concepts with insufficient information to code with codable children: Secondary | ICD-10-CM

## 2017-07-23 DIAGNOSIS — I10 Essential (primary) hypertension: Secondary | ICD-10-CM

## 2017-07-23 DIAGNOSIS — E1140 Type 2 diabetes mellitus with diabetic neuropathy, unspecified: Secondary | ICD-10-CM

## 2017-07-23 DIAGNOSIS — E1165 Type 2 diabetes mellitus with hyperglycemia: Secondary | ICD-10-CM

## 2017-07-23 NOTE — Patient Instructions (Signed)
Medication Instructions:  Eliquis sample given  Labwork: None  Testing/Procedures: None  Follow-Up: 6 months  Any Other Special Instructions Will Be Listed Below (If Applicable).     If you need a refill on your cardiac medications before your next appointment, please call your pharmacy.

## 2017-07-23 NOTE — Progress Notes (Signed)
Cardiology Office Note:    Date:  07/23/2017   ID:  Joseph [name], DOB 1950-10-31, MRN [id number]  PCP:  Joseph Sheriff, MD  Cardiologist:  Joseph Lindau, MD   Referring MD: No ref. provider found    ASSESSMENT:    1. Coronary artery disease involving native coronary artery of native heart without angina pectoris   2. Benign essential hypertension   3. Type 2 diabetes, uncontrolled, with neuropathy ([place])   4. Mixed hyperlipidemia   5. Insulin long-term use (HCC)    PLAN:    In order of problems listed above:  1. Secondary prevention stressed to patient. Importance of compliance with diet and medications stressed and he vocalized understanding. 2. His blood pressure is stable 3. He wants to get his complete blood work done by his primary care physician in the next few days and he will get in touch with [place] about this. 4. Importance of regular exercise stressed and diet was discussed for diabetes mellitus and dyslipidemia. 5. Patient will be seen in follow-up appointment in 6 months or earlier if the patient has any concerns.    Medication Adjustments/Labs and Tests Ordered: Current medicines are reviewed at length with the patient today.  Concerns regarding medicines are outlined above.  No orders of the defined types were placed in this encounter.  No orders of the defined types were placed in this encounter.    History of Present Illness:    Joseph [name] is a 66 y.o. male who is being seen today for the evaluation of coronary artery disease and to get established. Patient is a pleasant 66 year old male. He has past medical history of paroxysmal atrial fibrillation and coronary artery disease. He underwent CABG surgery earlier this year. He has history of essential hypertension, diabetes mellitus and dyslipidemia. He leads a sedentary lifestyle. I have taken care of him in my previous practice and now is here to transfer her care and to get established. At the time of  my evaluation is alert awake oriented and in no distress.  Past Medical History:  Diagnosis Date  . Atrial fibrillation ([place])   . CAD (coronary artery disease)   . Diabetes mellitus ([place])   . Essential hypertension   . Pure hypercholesterolemia     Past Surgical History:  Procedure Laterality Date  . ABLATION OF DYSRHYTHMIC FOCUS    . big toe removed    . cardiac catheterization    . CORONARY STENT PLACEMENT    . tonsillectomy      Current Medications: Current Meds  Medication Sig  . apixaban (ELIQUIS) 5 MG TABS tablet Take 5 mg by mouth 2 (two) times daily.  Marland Kitchen aspirin EC 81 MG tablet Take 81 mg by mouth daily.  Marland Kitchen atorvastatin (LIPITOR) 40 MG tablet Take 40 mg by mouth daily.  Marland Kitchen gabapentin (NEURONTIN) 300 MG capsule Take 300 mg by mouth daily.  Marland Kitchen LEVEMIR 100 UNIT/ML injection Inject 40 mg into the skin daily.  Marland Kitchen lisinopril (PRINIVIL,ZESTRIL) 20 MG tablet Take 20 mg by mouth daily.  . magnesium oxide (MAG-OX) 400 MG tablet Take 400 mg by mouth daily.  . metoprolol tartrate (LOPRESSOR) 50 MG tablet Take 50 mg by mouth 2 (two) times daily.  Marland Kitchen NOVOLOG FLEXPEN 100 UNIT/ML FlexPen Inject 18-20 Units into the skin daily.  . [DISCONTINUED] metoprolol succinate (TOPROL-XL) 50 MG 24 hr tablet Take 50 mg by mouth 2 (two) times daily.      Allergies:   Patient has no  known allergies.   Social History   Social History  . Marital status: Unknown    Spouse name: N/A  . Number of children: N/A  . Years of education: N/A   Social History Main Topics  . Smoking status: Former Research scientist (life sciences)  . Smokeless tobacco: Never Used  . Alcohol use Yes  . Drug use: No  . Sexual activity: Not Asked   Other Topics Concern  . None   Social History Narrative  . None     Family History: The patient's family history includes Cancer in his father; Heart disease in his mother.  ROS:   Please see the history of present illness.    All other systems reviewed and are negative.  EKGs/Labs/Other  Studies Reviewed:    The following studies were reviewed today: I reviewed previous hospital records and office records extensively and patient had multiple questions which were answered to his satisfaction.   Recent Labs: No results found for requested labs within last 8760 hours.  Recent Lipid Panel No results found for: CHOL, TRIG, HDL, CHOLHDL, VLDL, LDLCALC, LDLDIRECT  Physical Exam:    VS:  BP (!) 144/80   Pulse (!) 55   Ht 5\' 10"  (1.778 m)   Wt 197 lb 6.4 oz (89.5 kg)   SpO2 98%   BMI 28.32 kg/m     Wt Readings from Last 3 Encounters:  07/23/17 197 lb 6.4 oz (89.5 kg)     GEN: Patient is in no acute distress HEENT: Normal NECK: No JVD; No carotid bruits LYMPHATICS: No lymphadenopathy CARDIAC: S1 S2 regular, 2/6 systolic murmur at the apex. RESPIRATORY:  Clear to auscultation without rales, wheezing or rhonchi  ABDOMEN: Soft, non-tender, non-distended MUSCULOSKELETAL:  No edema; No deformity  SKIN: Warm and dry NEUROLOGIC:  Alert and oriented x 3 PSYCHIATRIC:  Normal affect    Signed, Joseph Lindau, MD  07/23/2017 11:34 AM    [place]

## 2017-08-11 DIAGNOSIS — E113211 Type 2 diabetes mellitus with mild nonproliferative diabetic retinopathy with macular edema, right eye: Secondary | ICD-10-CM | POA: Diagnosis not present

## 2017-08-11 DIAGNOSIS — H25813 Combined forms of age-related cataract, bilateral: Secondary | ICD-10-CM | POA: Diagnosis not present

## 2017-09-10 DIAGNOSIS — E1140 Type 2 diabetes mellitus with diabetic neuropathy, unspecified: Secondary | ICD-10-CM | POA: Diagnosis not present

## 2017-09-10 DIAGNOSIS — E11621 Type 2 diabetes mellitus with foot ulcer: Secondary | ICD-10-CM | POA: Diagnosis not present

## 2017-09-10 DIAGNOSIS — Z89412 Acquired absence of left great toe: Secondary | ICD-10-CM | POA: Diagnosis not present

## 2017-09-10 DIAGNOSIS — E08621 Diabetes mellitus due to underlying condition with foot ulcer: Secondary | ICD-10-CM

## 2017-09-10 DIAGNOSIS — L97422 Non-pressure chronic ulcer of left heel and midfoot with fat layer exposed: Secondary | ICD-10-CM

## 2017-09-10 HISTORY — DX: Diabetes mellitus due to underlying condition with foot ulcer: L97.422

## 2017-09-10 HISTORY — DX: Diabetes mellitus due to underlying condition with foot ulcer: E08.621

## 2017-10-01 DIAGNOSIS — E11610 Type 2 diabetes mellitus with diabetic neuropathic arthropathy: Secondary | ICD-10-CM | POA: Diagnosis not present

## 2017-10-01 DIAGNOSIS — E1140 Type 2 diabetes mellitus with diabetic neuropathy, unspecified: Secondary | ICD-10-CM | POA: Diagnosis not present

## 2017-10-01 DIAGNOSIS — L97422 Non-pressure chronic ulcer of left heel and midfoot with fat layer exposed: Secondary | ICD-10-CM | POA: Diagnosis not present

## 2017-10-01 DIAGNOSIS — E11621 Type 2 diabetes mellitus with foot ulcer: Secondary | ICD-10-CM | POA: Diagnosis not present

## 2017-10-10 DIAGNOSIS — E1165 Type 2 diabetes mellitus with hyperglycemia: Secondary | ICD-10-CM | POA: Diagnosis not present

## 2017-10-10 DIAGNOSIS — E1140 Type 2 diabetes mellitus with diabetic neuropathy, unspecified: Secondary | ICD-10-CM | POA: Diagnosis not present

## 2017-10-10 DIAGNOSIS — Z794 Long term (current) use of insulin: Secondary | ICD-10-CM | POA: Diagnosis not present

## 2017-10-10 DIAGNOSIS — I10 Essential (primary) hypertension: Secondary | ICD-10-CM | POA: Diagnosis not present

## 2017-10-10 DIAGNOSIS — E7800 Pure hypercholesterolemia, unspecified: Secondary | ICD-10-CM | POA: Diagnosis not present

## 2017-10-22 DIAGNOSIS — E1140 Type 2 diabetes mellitus with diabetic neuropathy, unspecified: Secondary | ICD-10-CM | POA: Diagnosis not present

## 2017-10-22 DIAGNOSIS — E11621 Type 2 diabetes mellitus with foot ulcer: Secondary | ICD-10-CM | POA: Diagnosis not present

## 2017-10-22 DIAGNOSIS — L97422 Non-pressure chronic ulcer of left heel and midfoot with fat layer exposed: Secondary | ICD-10-CM | POA: Diagnosis not present

## 2017-10-22 DIAGNOSIS — E11610 Type 2 diabetes mellitus with diabetic neuropathic arthropathy: Secondary | ICD-10-CM | POA: Diagnosis not present

## 2017-11-17 DIAGNOSIS — E1140 Type 2 diabetes mellitus with diabetic neuropathy, unspecified: Secondary | ICD-10-CM | POA: Diagnosis not present

## 2017-11-17 DIAGNOSIS — E1165 Type 2 diabetes mellitus with hyperglycemia: Secondary | ICD-10-CM | POA: Diagnosis not present

## 2017-11-17 DIAGNOSIS — E11610 Type 2 diabetes mellitus with diabetic neuropathic arthropathy: Secondary | ICD-10-CM | POA: Diagnosis not present

## 2017-11-17 DIAGNOSIS — B351 Tinea unguium: Secondary | ICD-10-CM

## 2017-11-17 DIAGNOSIS — Z89412 Acquired absence of left great toe: Secondary | ICD-10-CM | POA: Diagnosis not present

## 2017-11-17 HISTORY — DX: Tinea unguium: B35.1

## 2017-12-22 DIAGNOSIS — J324 Chronic pansinusitis: Secondary | ICD-10-CM | POA: Diagnosis not present

## 2017-12-30 DIAGNOSIS — Z6827 Body mass index (BMI) 27.0-27.9, adult: Secondary | ICD-10-CM | POA: Diagnosis not present

## 2017-12-30 DIAGNOSIS — I10 Essential (primary) hypertension: Secondary | ICD-10-CM | POA: Diagnosis not present

## 2017-12-30 DIAGNOSIS — R5383 Other fatigue: Secondary | ICD-10-CM | POA: Diagnosis not present

## 2017-12-30 DIAGNOSIS — E785 Hyperlipidemia, unspecified: Secondary | ICD-10-CM | POA: Diagnosis not present

## 2017-12-30 DIAGNOSIS — Z125 Encounter for screening for malignant neoplasm of prostate: Secondary | ICD-10-CM | POA: Diagnosis not present

## 2017-12-30 DIAGNOSIS — Z79899 Other long term (current) drug therapy: Secondary | ICD-10-CM | POA: Diagnosis not present

## 2017-12-30 DIAGNOSIS — D519 Vitamin B12 deficiency anemia, unspecified: Secondary | ICD-10-CM | POA: Diagnosis not present

## 2017-12-30 DIAGNOSIS — Z0000 Encounter for general adult medical examination without abnormal findings: Secondary | ICD-10-CM | POA: Diagnosis not present

## 2018-01-02 DIAGNOSIS — S90811A Abrasion, right foot, initial encounter: Secondary | ICD-10-CM | POA: Diagnosis not present

## 2018-01-05 DIAGNOSIS — S90424A Blister (nonthermal), right lesser toe(s), initial encounter: Secondary | ICD-10-CM | POA: Diagnosis not present

## 2018-01-05 DIAGNOSIS — E11610 Type 2 diabetes mellitus with diabetic neuropathic arthropathy: Secondary | ICD-10-CM | POA: Diagnosis not present

## 2018-01-05 DIAGNOSIS — L089 Local infection of the skin and subcutaneous tissue, unspecified: Secondary | ICD-10-CM

## 2018-01-05 DIAGNOSIS — Z89412 Acquired absence of left great toe: Secondary | ICD-10-CM | POA: Diagnosis not present

## 2018-01-05 DIAGNOSIS — E1042 Type 1 diabetes mellitus with diabetic polyneuropathy: Secondary | ICD-10-CM | POA: Diagnosis not present

## 2018-01-05 HISTORY — DX: Local infection of the skin and subcutaneous tissue, unspecified: L08.9

## 2018-01-12 DIAGNOSIS — E1065 Type 1 diabetes mellitus with hyperglycemia: Secondary | ICD-10-CM | POA: Diagnosis not present

## 2018-01-12 DIAGNOSIS — E1042 Type 1 diabetes mellitus with diabetic polyneuropathy: Secondary | ICD-10-CM | POA: Diagnosis not present

## 2018-01-12 DIAGNOSIS — S90424D Blister (nonthermal), right lesser toe(s), subsequent encounter: Secondary | ICD-10-CM | POA: Diagnosis not present

## 2018-01-12 DIAGNOSIS — Z89412 Acquired absence of left great toe: Secondary | ICD-10-CM | POA: Diagnosis not present

## 2018-01-14 DIAGNOSIS — Z794 Long term (current) use of insulin: Secondary | ICD-10-CM | POA: Diagnosis not present

## 2018-01-14 DIAGNOSIS — E1140 Type 2 diabetes mellitus with diabetic neuropathy, unspecified: Secondary | ICD-10-CM | POA: Diagnosis not present

## 2018-01-14 DIAGNOSIS — E1165 Type 2 diabetes mellitus with hyperglycemia: Secondary | ICD-10-CM | POA: Diagnosis not present

## 2018-01-14 DIAGNOSIS — I10 Essential (primary) hypertension: Secondary | ICD-10-CM | POA: Diagnosis not present

## 2018-01-14 DIAGNOSIS — E7800 Pure hypercholesterolemia, unspecified: Secondary | ICD-10-CM | POA: Diagnosis not present

## 2018-01-19 ENCOUNTER — Ambulatory Visit (INDEPENDENT_AMBULATORY_CARE_PROVIDER_SITE_OTHER): Payer: PPO | Admitting: Cardiology

## 2018-01-19 ENCOUNTER — Encounter: Payer: Self-pay | Admitting: Cardiology

## 2018-01-19 VITALS — BP 130/72 | HR 60 | Ht 70.0 in | Wt 198.0 lb

## 2018-01-19 DIAGNOSIS — I2510 Atherosclerotic heart disease of native coronary artery without angina pectoris: Secondary | ICD-10-CM

## 2018-01-19 DIAGNOSIS — L97422 Non-pressure chronic ulcer of left heel and midfoot with fat layer exposed: Secondary | ICD-10-CM | POA: Diagnosis not present

## 2018-01-19 DIAGNOSIS — I10 Essential (primary) hypertension: Secondary | ICD-10-CM

## 2018-01-19 DIAGNOSIS — E08621 Diabetes mellitus due to underlying condition with foot ulcer: Secondary | ICD-10-CM

## 2018-01-19 DIAGNOSIS — E782 Mixed hyperlipidemia: Secondary | ICD-10-CM

## 2018-01-19 MED ORDER — NITROGLYCERIN 0.4 MG SL SUBL: 0.4000 mg | SUBLINGUAL_TABLET | SUBLINGUAL | 11 refills | Status: DC | PRN

## 2018-01-19 NOTE — Progress Notes (Signed)
Cardiology Office Note:    Date:  01/19/2018   ID:  Joseph [name], DOB October 20, 1950, MRN [id number]  PCP:  Angelina Sheriff, MD  Cardiologist:  Jenean Lindau, MD   Referring MD: Angelina Sheriff, MD    ASSESSMENT:    1. Benign essential hypertension   2. Coronary artery disease involving native coronary artery of native heart without angina pectoris   3. Diabetic ulcer of left midfoot associated with diabetes mellitus due to underlying condition, with fat layer exposed ([place])   4. Mixed hyperlipidemia    PLAN:    In order of problems listed above:  1. Secondary prevention stressed with the patient.  Importance of compliance with diet and medications stressed and he vocalized understanding.  His blood pressure stable.  Diet was discussed for dyslipidemia and diabetes mellitus.  Blood work was done by his primary care physician. 2. Sublingual nitroglycerin prescription was sent is protocol and 911 protocol explained and he vocalized understanding.  We will obtain a copy of his blood work from his primary care physician. 3. I discussed with the patient atrial fibrillation, disease process. Management and therapy including rate and rhythm control, anticoagulation benefits and potential risks were discussed extensively with the patient. Patient had multiple questions which were answered to patient's satisfaction. 4. Patient will be seen in follow-up appointment in 6 months or earlier if the patient has any concerns    Medication Adjustments/Labs and Tests Ordered: Current medicines are reviewed at length with the patient today.  Concerns regarding medicines are outlined above.  No orders of the defined types were placed in this encounter.  Meds ordered this encounter  Medications  . nitroGLYCERIN (NITROSTAT) 0.4 MG SL tablet    Sig: Place 1 tablet (0.4 mg total) under the tongue every 5 (five) minutes as needed for chest pain.    Dispense:  25 tablet    Refill:  11     Chief  Complaint  Patient presents with  . Follow-up  . Coronary Artery Disease     History of Present Illness:    Joseph [name] is a 67 y.o. male.  The patient has past medical history of coronary Artery disease, atrial fibrillation, diabetes mellitus and dyslipidemia.  He has multiple issues with his feet and his being treated for wounds which are healing.  He denies any chest pain orthopnea or PND.  He does a stationary bicycle without any symptoms.  He is here for routine follow-up.  He mentions to me that he has blood work done by his primary care physician.  At the time of my evaluation, the patient is alert awake oriented and in no distress.  Past Medical History:  Diagnosis Date  . Atrial fibrillation ([place])   . CAD (coronary artery disease)   . Diabetes mellitus ([place])   . Essential hypertension   . Pure hypercholesterolemia     Past Surgical History:  Procedure Laterality Date  . ABLATION OF DYSRHYTHMIC FOCUS    . big toe removed    . cardiac catheterization    . CORONARY STENT PLACEMENT    . tonsillectomy      Current Medications: Current Meds  Medication Sig  . acetaminophen (TYLENOL) 500 MG tablet Take by mouth.  Marland Kitchen apixaban (ELIQUIS) 5 MG TABS tablet Take 5 mg by mouth 2 (two) times daily.  Marland Kitchen aspirin EC 81 MG tablet Take 81 mg by mouth daily.  Marland Kitchen atorvastatin (LIPITOR) 40 MG tablet Take 40 mg by mouth  daily.  Marland Kitchen LEVEMIR 100 UNIT/ML injection Inject 40 mg into the skin daily.  Marland Kitchen lisinopril (PRINIVIL,ZESTRIL) 20 MG tablet Take 20 mg by mouth daily.  . metoprolol tartrate (LOPRESSOR) 50 MG tablet Take 50 mg by mouth 2 (two) times daily.  Marland Kitchen NOVOLOG FLEXPEN 100 UNIT/ML FlexPen Inject 18-20 Units into the skin daily.     Allergies:   Patient has no known allergies.   Social History   Socioeconomic History  . Marital status: Unknown    Spouse name: Not on file  . Number of children: Not on file  . Years of education: Not on file  . Highest education level: Not on file    Occupational History  . Not on file  Social Needs  . Financial resource strain: Not on file  . Food insecurity:    Worry: Not on file    Inability: Not on file  . Transportation needs:    Medical: Not on file    Non-medical: Not on file  Tobacco Use  . Smoking status: Former Research scientist (life sciences)  . Smokeless tobacco: Never Used  Substance and Sexual Activity  . Alcohol use: Yes  . Drug use: No  . Sexual activity: Not on file  Lifestyle  . Physical activity:    Days per week: Not on file    Minutes per session: Not on file  . Stress: Not on file  Relationships  . Social connections:    Talks on phone: Not on file    Gets together: Not on file    Attends religious service: Not on file    Active member of club or organization: Not on file    Attends meetings of clubs or organizations: Not on file    Relationship status: Not on file  Other Topics Concern  . Not on file  Social History Narrative  . Not on file     Family History: The patient's family history includes Cancer in his father; Heart disease in his mother.  ROS:   Please see the history of present illness.    All other systems reviewed and are negative.  EKGs/Labs/Other Studies Reviewed:    The following studies were reviewed today: I discussed the findings of the test with the patient at length.  EKG reveals sinus rhythm and nonspecific ST-T changes.   Recent Labs: No results found for requested labs within last 8760 hours.  Recent Lipid Panel No results found for: CHOL, TRIG, HDL, CHOLHDL, VLDL, LDLCALC, LDLDIRECT  Physical Exam:    VS:  BP 130/72 (BP Location: Left Arm, Patient Position: Sitting, Cuff Size: Normal)   Pulse 60   Ht 5\' 10"  (1.778 m)   Wt 198 lb (89.8 kg)   SpO2 98%   BMI 28.41 kg/m     Wt Readings from Last 3 Encounters:  01/19/18 198 lb (89.8 kg)  07/23/17 197 lb 6.4 oz (89.5 kg)     GEN: Patient is in no acute distress HEENT: Normal NECK: No JVD; No carotid bruits LYMPHATICS: No  lymphadenopathy CARDIAC: Hear sounds regular, 2/6 systolic murmur at the apex. RESPIRATORY:  Clear to auscultation without rales, wheezing or rhonchi  ABDOMEN: Soft, non-tender, non-distended MUSCULOSKELETAL:  No edema; No deformity  SKIN: Warm and dry NEUROLOGIC:  Alert and oriented x 3 PSYCHIATRIC:  Normal affect   Signed, Jenean Lindau, MD  01/19/2018 10:57 AM    [place]

## 2018-01-19 NOTE — Addendum Note (Signed)
Addended by: Austin Miles on: 01/19/2018 11:57 AM   Modules accepted: Orders

## 2018-01-19 NOTE — Patient Instructions (Signed)
Medication Instructions:  Your physician has recommended you make the following change in your medication:  START Nitroglycerin 0.4 mg sublingual (under your tongue) as needed for chest pain. If experiencing chest pain, stop what you are doing and sit down. Take 1 nitroglycerin and wait 5 minutes. If chest pain continues, take another nitroglycerin and wait 5 minutes. If chest pain does not subside, take 1 more nitroglycerin and dial 911. You make take a total of 3 nitroglycerin in a 15 minute time frame  Labwork: None  Testing/Procedures: None  Follow-Up: Your physician recommends that you schedule a follow-up appointment in: 6 months  Any Other Special Instructions Will Be Listed Below (If Applicable).     If you need a refill on your cardiac medications before your next appointment, please call your pharmacy.   [facility]  [place]ley A, RN, BSN  

## 2018-01-26 DIAGNOSIS — E11621 Type 2 diabetes mellitus with foot ulcer: Secondary | ICD-10-CM | POA: Diagnosis not present

## 2018-01-26 DIAGNOSIS — E1140 Type 2 diabetes mellitus with diabetic neuropathy, unspecified: Secondary | ICD-10-CM | POA: Diagnosis not present

## 2018-01-26 DIAGNOSIS — Z89412 Acquired absence of left great toe: Secondary | ICD-10-CM | POA: Diagnosis not present

## 2018-01-26 DIAGNOSIS — E11610 Type 2 diabetes mellitus with diabetic neuropathic arthropathy: Secondary | ICD-10-CM | POA: Diagnosis not present

## 2018-01-26 DIAGNOSIS — L97511 Non-pressure chronic ulcer of other part of right foot limited to breakdown of skin: Secondary | ICD-10-CM | POA: Diagnosis not present

## 2018-02-10 DIAGNOSIS — H35369 Drusen (degenerative) of macula, unspecified eye: Secondary | ICD-10-CM | POA: Diagnosis not present

## 2018-02-10 DIAGNOSIS — H35373 Puckering of macula, bilateral: Secondary | ICD-10-CM | POA: Diagnosis not present

## 2018-02-10 DIAGNOSIS — E113293 Type 2 diabetes mellitus with mild nonproliferative diabetic retinopathy without macular edema, bilateral: Secondary | ICD-10-CM | POA: Diagnosis not present

## 2018-02-16 DIAGNOSIS — E1140 Type 2 diabetes mellitus with diabetic neuropathy, unspecified: Secondary | ICD-10-CM | POA: Diagnosis not present

## 2018-02-16 DIAGNOSIS — L97511 Non-pressure chronic ulcer of other part of right foot limited to breakdown of skin: Secondary | ICD-10-CM | POA: Diagnosis not present

## 2018-02-16 DIAGNOSIS — E11621 Type 2 diabetes mellitus with foot ulcer: Secondary | ICD-10-CM | POA: Diagnosis not present

## 2018-02-16 DIAGNOSIS — E11610 Type 2 diabetes mellitus with diabetic neuropathic arthropathy: Secondary | ICD-10-CM | POA: Diagnosis not present

## 2018-03-12 ENCOUNTER — Telehealth: Payer: Self-pay

## 2018-03-12 DIAGNOSIS — I10 Essential (primary) hypertension: Secondary | ICD-10-CM | POA: Diagnosis not present

## 2018-03-12 DIAGNOSIS — E162 Hypoglycemia, unspecified: Secondary | ICD-10-CM | POA: Diagnosis not present

## 2018-03-12 DIAGNOSIS — R000 Tachycardia, unspecified: Secondary | ICD-10-CM | POA: Diagnosis not present

## 2018-03-12 DIAGNOSIS — E161 Other hypoglycemia: Secondary | ICD-10-CM | POA: Diagnosis not present

## 2018-03-12 DIAGNOSIS — R0902 Hypoxemia: Secondary | ICD-10-CM | POA: Diagnosis not present

## 2018-03-12 DIAGNOSIS — E1165 Type 2 diabetes mellitus with hyperglycemia: Secondary | ICD-10-CM | POA: Diagnosis not present

## 2018-03-12 DIAGNOSIS — E119 Type 2 diabetes mellitus without complications: Secondary | ICD-10-CM | POA: Diagnosis not present

## 2018-03-12 DIAGNOSIS — G9341 Metabolic encephalopathy: Secondary | ICD-10-CM | POA: Diagnosis not present

## 2018-03-12 DIAGNOSIS — R680 Hypothermia, not associated with low environmental temperature: Secondary | ICD-10-CM | POA: Diagnosis not present

## 2018-03-12 DIAGNOSIS — I340 Nonrheumatic mitral (valve) insufficiency: Secondary | ICD-10-CM | POA: Diagnosis not present

## 2018-03-12 DIAGNOSIS — Z7982 Long term (current) use of aspirin: Secondary | ICD-10-CM | POA: Diagnosis not present

## 2018-03-12 DIAGNOSIS — Z79899 Other long term (current) drug therapy: Secondary | ICD-10-CM | POA: Diagnosis not present

## 2018-03-12 DIAGNOSIS — Z87891 Personal history of nicotine dependence: Secondary | ICD-10-CM | POA: Diagnosis not present

## 2018-03-12 DIAGNOSIS — E785 Hyperlipidemia, unspecified: Secondary | ICD-10-CM | POA: Diagnosis not present

## 2018-03-12 DIAGNOSIS — I959 Hypotension, unspecified: Secondary | ICD-10-CM | POA: Diagnosis not present

## 2018-03-12 DIAGNOSIS — I110 Hypertensive heart disease with heart failure: Secondary | ICD-10-CM | POA: Diagnosis not present

## 2018-03-12 DIAGNOSIS — E11649 Type 2 diabetes mellitus with hypoglycemia without coma: Secondary | ICD-10-CM | POA: Diagnosis not present

## 2018-03-12 DIAGNOSIS — I4891 Unspecified atrial fibrillation: Secondary | ICD-10-CM | POA: Diagnosis not present

## 2018-03-12 DIAGNOSIS — T68XXXA Hypothermia, initial encounter: Secondary | ICD-10-CM | POA: Diagnosis not present

## 2018-03-12 DIAGNOSIS — I2510 Atherosclerotic heart disease of native coronary artery without angina pectoris: Secondary | ICD-10-CM | POA: Diagnosis not present

## 2018-03-12 DIAGNOSIS — Z951 Presence of aortocoronary bypass graft: Secondary | ICD-10-CM | POA: Diagnosis not present

## 2018-03-12 NOTE — Telephone Encounter (Signed)
Left voicemail for patient to discuss medications.

## 2018-03-16 DIAGNOSIS — E11621 Type 2 diabetes mellitus with foot ulcer: Secondary | ICD-10-CM | POA: Diagnosis not present

## 2018-03-16 DIAGNOSIS — E1140 Type 2 diabetes mellitus with diabetic neuropathy, unspecified: Secondary | ICD-10-CM | POA: Diagnosis not present

## 2018-03-16 DIAGNOSIS — L97512 Non-pressure chronic ulcer of other part of right foot with fat layer exposed: Secondary | ICD-10-CM | POA: Diagnosis not present

## 2018-03-16 DIAGNOSIS — Z89412 Acquired absence of left great toe: Secondary | ICD-10-CM | POA: Diagnosis not present

## 2018-03-16 NOTE — Telephone Encounter (Signed)
Spoke with patient about medication assistance form, verified that the pt aware of this request. Will get form filled out and sent in asap. Pt satisfied with that.

## 2018-03-17 ENCOUNTER — Other Ambulatory Visit: Payer: Self-pay

## 2018-03-17 NOTE — Patient Outreach (Signed)
[facility]) Care Management  03/17/2018  Noell Shular [birth year] [id number]   Referral received. No outreach warranted at this time. Transition of Care  will be completed by primary care provider office who will refer to [facility] care management if needed.  Plan: RN CM will close case.  Jone Baseman, RN, MSN [facility] Care Management Care Management Coordinator Direct Line [phone] Toll Free: [phone]  Fax: [phone]

## 2018-03-25 ENCOUNTER — Encounter: Payer: Self-pay | Admitting: Cardiology

## 2018-03-25 ENCOUNTER — Ambulatory Visit (INDEPENDENT_AMBULATORY_CARE_PROVIDER_SITE_OTHER): Payer: PPO | Admitting: Cardiology

## 2018-03-25 VITALS — BP 128/62 | HR 58 | Ht 70.0 in | Wt 196.0 lb

## 2018-03-25 DIAGNOSIS — I10 Essential (primary) hypertension: Secondary | ICD-10-CM

## 2018-03-25 DIAGNOSIS — I2510 Atherosclerotic heart disease of native coronary artery without angina pectoris: Secondary | ICD-10-CM | POA: Diagnosis not present

## 2018-03-25 DIAGNOSIS — IMO0002 Reserved for concepts with insufficient information to code with codable children: Secondary | ICD-10-CM

## 2018-03-25 DIAGNOSIS — E1140 Type 2 diabetes mellitus with diabetic neuropathy, unspecified: Secondary | ICD-10-CM

## 2018-03-25 DIAGNOSIS — E782 Mixed hyperlipidemia: Secondary | ICD-10-CM

## 2018-03-25 DIAGNOSIS — E1165 Type 2 diabetes mellitus with hyperglycemia: Secondary | ICD-10-CM | POA: Diagnosis not present

## 2018-03-25 DIAGNOSIS — I340 Nonrheumatic mitral (valve) insufficiency: Secondary | ICD-10-CM

## 2018-03-25 HISTORY — DX: Nonrheumatic mitral (valve) insufficiency: I34.0

## 2018-03-25 NOTE — Progress Notes (Signed)
Cardiology Office Note:    Date:  03/25/2018   ID:  Joseph [name], DOB September 25, 1951, MRN [id number]  PCP:  Angelina Sheriff, MD  Cardiologist:  Jenean Lindau, MD   Referring MD: Angelina Sheriff, MD    ASSESSMENT:    1. Coronary artery disease involving native coronary artery of native heart without angina pectoris   2. Benign essential hypertension   3. Type 2 diabetes, uncontrolled, with neuropathy ([place])   4. Mixed hyperlipidemia    PLAN:    In order of problems listed above:  1. Secondary prevention stressed with the patient.  Importance of compliance with diet and medication stressed and he vocalized understanding.  His blood pressure is stable. 2. I reviewed hospital records extensively.  The patient has significant mitral regurgitation.  I mentioned to him that he will need to optimize his lifestyle including his medication intake and be compliant with and be very compliant with exercise and he vocalized understanding and is agreeable. 3. He will be seen in follow-up appointment 3 months or earlier if he has any concerns.  At that time I will do a repeat echocardiogram to assess the status of his valvular heart disease. 4. I discussed with the patient atrial fibrillation, disease process. Management and therapy including rate and rhythm control, anticoagulation benefits and potential risks were discussed extensively with the patient. Patient had multiple questions which were answered to patient's satisfaction.    Medication Adjustments/Labs and Tests Ordered: Current medicines are reviewed at length with the patient today.  Concerns regarding medicines are outlined above.  No orders of the defined types were placed in this encounter.  No orders of the defined types were placed in this encounter.    No chief complaint on file.    History of Present Illness:    Joseph [name] is a 67 y.o. male.  The patient has history of coronary artery disease post stenting and CABG  surgery.  He was admitted to [facility].  He was found to be significantly hypoglycemic with issues with consciousness.  At that time he was also found to be in atrial fibrillation and is on anticoagulation at this time.  Since hospital discharge she is active and denies any problems.  No chest pain orthopnea or PND.  At the time of my evaluation, the patient is alert awake oriented and in no distress.  Past Medical History:  Diagnosis Date  . Atrial fibrillation ([place])   . CAD (coronary artery disease)   . Diabetes mellitus ([place])   . Essential hypertension   . Pure hypercholesterolemia     Past Surgical History:  Procedure Laterality Date  . ABLATION OF DYSRHYTHMIC FOCUS    . big toe removed    . cardiac catheterization    . CORONARY STENT PLACEMENT    . tonsillectomy      Current Medications: Current Meds  Medication Sig  . acetaminophen (TYLENOL) 500 MG tablet Take 500 mg by mouth every 4 (four) hours as needed.   Marland Kitchen apixaban (ELIQUIS) 5 MG TABS tablet Take 5 mg by mouth 2 (two) times daily.  Marland Kitchen aspirin EC 81 MG tablet Take 81 mg by mouth daily.  Marland Kitchen atorvastatin (LIPITOR) 40 MG tablet Take 40 mg by mouth daily.  . Cyanocobalamin (VITAMIN B 12 PO) Take 1,000 mg by mouth 2 (two) times daily.  Marland Kitchen LEVEMIR 100 UNIT/ML injection Inject 36 mg into the skin daily.   Marland Kitchen lisinopril (PRINIVIL,ZESTRIL) 20 MG tablet Take 20  mg by mouth daily.  . metoprolol tartrate (LOPRESSOR) 50 MG tablet Take 50 mg by mouth 2 (two) times daily.  . nitroGLYCERIN (NITROSTAT) 0.4 MG SL tablet Place 1 tablet (0.4 mg total) under the tongue every 5 (five) minutes as needed for chest pain.  Marland Kitchen NOVOLOG FLEXPEN 100 UNIT/ML FlexPen Inject 18-20 Units into the skin daily.     Allergies:   Patient has no known allergies.   Social History   Socioeconomic History  . Marital status: Unknown    Spouse name: Not on file  . Number of children: Not on file  . Years of education: Not on file  . Highest education  level: Not on file  Occupational History  . Not on file  Social Needs  . Financial resource strain: Not on file  . Food insecurity:    Worry: Not on file    Inability: Not on file  . Transportation needs:    Medical: Not on file    Non-medical: Not on file  Tobacco Use  . Smoking status: Former Research scientist (life sciences)  . Smokeless tobacco: Never Used  Substance and Sexual Activity  . Alcohol use: Yes  . Drug use: No  . Sexual activity: Not on file  Lifestyle  . Physical activity:    Days per week: Not on file    Minutes per session: Not on file  . Stress: Not on file  Relationships  . Social connections:    Talks on phone: Not on file    Gets together: Not on file    Attends religious service: Not on file    Active member of club or organization: Not on file    Attends meetings of clubs or organizations: Not on file    Relationship status: Not on file  Other Topics Concern  . Not on file  Social History Narrative  . Not on file     Family History: The patient's family history includes Cancer in his father; Heart disease in his mother.  ROS:   Please see the history of present illness.    All other systems reviewed and are negative.  EKGs/Labs/Other Studies Reviewed:    The following studies were reviewed today: I discussed my findings based on [place] hospital records which I evaluated at extensive length.   Recent Labs: No results found for requested labs within last 8760 hours.  Recent Lipid Panel No results found for: CHOL, TRIG, HDL, CHOLHDL, VLDL, LDLCALC, LDLDIRECT  Physical Exam:    VS:  BP 128/62 (BP Location: Right Arm, Patient Position: Sitting, Cuff Size: Normal)   Pulse (!) 58   Ht 5\' 10"  (1.778 m)   Wt 196 lb (88.9 kg)   SpO2 98%   BMI 28.12 kg/m     Wt Readings from Last 3 Encounters:  03/25/18 196 lb (88.9 kg)  01/19/18 198 lb (89.8 kg)  07/23/17 197 lb 6.4 oz (89.5 kg)     GEN: Patient is in no acute distress HEENT: Normal NECK: No JVD; No  carotid bruits LYMPHATICS: No lymphadenopathy CARDIAC: Hear sounds regular, 2/6 systolic murmur at the apex. RESPIRATORY:  Clear to auscultation without rales, wheezing or rhonchi  ABDOMEN: Soft, non-tender, non-distended MUSCULOSKELETAL:  No edema; No deformity  SKIN: Warm and dry NEUROLOGIC:  Alert and oriented x 3 PSYCHIATRIC:  Normal affect   Signed, Jenean Lindau, MD  03/25/2018 11:09 AM    [place]

## 2018-03-25 NOTE — Patient Instructions (Signed)

## 2018-03-26 ENCOUNTER — Other Ambulatory Visit: Payer: Self-pay

## 2018-03-26 MED ORDER — APIXABAN 5 MG PO TABS: 5.0000 mg | ORAL_TABLET | Freq: Two times a day (BID) | ORAL | 3 refills | Status: DC

## 2018-04-13 DIAGNOSIS — L97422 Non-pressure chronic ulcer of left heel and midfoot with fat layer exposed: Secondary | ICD-10-CM | POA: Diagnosis not present

## 2018-04-13 DIAGNOSIS — E11621 Type 2 diabetes mellitus with foot ulcer: Secondary | ICD-10-CM | POA: Diagnosis not present

## 2018-04-13 DIAGNOSIS — E11610 Type 2 diabetes mellitus with diabetic neuropathic arthropathy: Secondary | ICD-10-CM | POA: Diagnosis not present

## 2018-04-13 DIAGNOSIS — E1140 Type 2 diabetes mellitus with diabetic neuropathy, unspecified: Secondary | ICD-10-CM | POA: Diagnosis not present

## 2018-04-17 ENCOUNTER — Telehealth: Payer: Self-pay | Admitting: Cardiology

## 2018-04-20 NOTE — Telephone Encounter (Signed)
Error

## 2018-04-23 ENCOUNTER — Other Ambulatory Visit: Payer: Self-pay | Admitting: *Deleted

## 2018-04-23 MED ORDER — APIXABAN 5 MG PO TABS: 5.0000 mg | ORAL_TABLET | Freq: Two times a day (BID) | ORAL | 3 refills | Status: DC

## 2018-04-30 ENCOUNTER — Telehealth: Payer: Self-pay

## 2018-04-30 NOTE — Telephone Encounter (Signed)
Left voicemail regarding patient assistance available at the office for patient to pick up.

## 2018-05-11 DIAGNOSIS — M1990 Unspecified osteoarthritis, unspecified site: Secondary | ICD-10-CM | POA: Diagnosis not present

## 2018-05-11 DIAGNOSIS — R079 Chest pain, unspecified: Secondary | ICD-10-CM | POA: Diagnosis not present

## 2018-05-11 DIAGNOSIS — J449 Chronic obstructive pulmonary disease, unspecified: Secondary | ICD-10-CM | POA: Diagnosis not present

## 2018-05-11 DIAGNOSIS — R509 Fever, unspecified: Secondary | ICD-10-CM | POA: Diagnosis not present

## 2018-05-11 DIAGNOSIS — E119 Type 2 diabetes mellitus without complications: Secondary | ICD-10-CM | POA: Diagnosis not present

## 2018-05-11 DIAGNOSIS — I4891 Unspecified atrial fibrillation: Secondary | ICD-10-CM | POA: Diagnosis not present

## 2018-05-18 DIAGNOSIS — E11621 Type 2 diabetes mellitus with foot ulcer: Secondary | ICD-10-CM | POA: Diagnosis not present

## 2018-05-18 DIAGNOSIS — L97422 Non-pressure chronic ulcer of left heel and midfoot with fat layer exposed: Secondary | ICD-10-CM | POA: Diagnosis not present

## 2018-05-18 DIAGNOSIS — E11610 Type 2 diabetes mellitus with diabetic neuropathic arthropathy: Secondary | ICD-10-CM | POA: Diagnosis not present

## 2018-05-18 DIAGNOSIS — Z89412 Acquired absence of left great toe: Secondary | ICD-10-CM | POA: Diagnosis not present

## 2018-05-27 DIAGNOSIS — E1165 Type 2 diabetes mellitus with hyperglycemia: Secondary | ICD-10-CM | POA: Diagnosis not present

## 2018-05-27 DIAGNOSIS — I10 Essential (primary) hypertension: Secondary | ICD-10-CM | POA: Diagnosis not present

## 2018-05-27 DIAGNOSIS — E7800 Pure hypercholesterolemia, unspecified: Secondary | ICD-10-CM | POA: Diagnosis not present

## 2018-05-27 DIAGNOSIS — E041 Nontoxic single thyroid nodule: Secondary | ICD-10-CM | POA: Diagnosis not present

## 2018-05-27 DIAGNOSIS — Z794 Long term (current) use of insulin: Secondary | ICD-10-CM | POA: Diagnosis not present

## 2018-05-27 DIAGNOSIS — E1140 Type 2 diabetes mellitus with diabetic neuropathy, unspecified: Secondary | ICD-10-CM | POA: Diagnosis not present

## 2018-06-03 DIAGNOSIS — E041 Nontoxic single thyroid nodule: Secondary | ICD-10-CM | POA: Diagnosis not present

## 2018-07-07 ENCOUNTER — Telehealth: Payer: Self-pay | Admitting: *Deleted

## 2018-07-07 MED ORDER — ATORVASTATIN CALCIUM 40 MG PO TABS: 40.0000 mg | ORAL_TABLET | Freq: Every day | ORAL | 3 refills | Status: DC

## 2018-07-07 NOTE — Telephone Encounter (Signed)
Refill for Atorvastatin

## 2018-07-08 DIAGNOSIS — Z89412 Acquired absence of left great toe: Secondary | ICD-10-CM | POA: Diagnosis not present

## 2018-07-08 DIAGNOSIS — L84 Corns and callosities: Secondary | ICD-10-CM

## 2018-07-08 DIAGNOSIS — E11610 Type 2 diabetes mellitus with diabetic neuropathic arthropathy: Secondary | ICD-10-CM | POA: Diagnosis not present

## 2018-07-08 DIAGNOSIS — E1165 Type 2 diabetes mellitus with hyperglycemia: Secondary | ICD-10-CM | POA: Diagnosis not present

## 2018-07-08 DIAGNOSIS — Z794 Long term (current) use of insulin: Secondary | ICD-10-CM | POA: Diagnosis not present

## 2018-07-08 DIAGNOSIS — E1140 Type 2 diabetes mellitus with diabetic neuropathy, unspecified: Secondary | ICD-10-CM | POA: Diagnosis not present

## 2018-07-08 HISTORY — DX: Corns and callosities: L84

## 2018-07-16 DIAGNOSIS — K219 Gastro-esophageal reflux disease without esophagitis: Secondary | ICD-10-CM | POA: Diagnosis not present

## 2018-07-16 DIAGNOSIS — Z01818 Encounter for other preprocedural examination: Secondary | ICD-10-CM | POA: Diagnosis not present

## 2018-07-21 ENCOUNTER — Encounter: Payer: Self-pay | Admitting: Cardiology

## 2018-07-21 ENCOUNTER — Ambulatory Visit (INDEPENDENT_AMBULATORY_CARE_PROVIDER_SITE_OTHER): Payer: PPO | Admitting: Cardiology

## 2018-07-21 VITALS — BP 140/82 | HR 75 | Ht 70.0 in | Wt 202.6 lb

## 2018-07-21 DIAGNOSIS — I340 Nonrheumatic mitral (valve) insufficiency: Secondary | ICD-10-CM

## 2018-07-21 DIAGNOSIS — E782 Mixed hyperlipidemia: Secondary | ICD-10-CM | POA: Diagnosis not present

## 2018-07-21 DIAGNOSIS — I10 Essential (primary) hypertension: Secondary | ICD-10-CM

## 2018-07-21 DIAGNOSIS — I2510 Atherosclerotic heart disease of native coronary artery without angina pectoris: Secondary | ICD-10-CM | POA: Diagnosis not present

## 2018-07-21 DIAGNOSIS — E1165 Type 2 diabetes mellitus with hyperglycemia: Secondary | ICD-10-CM | POA: Diagnosis not present

## 2018-07-21 DIAGNOSIS — E1140 Type 2 diabetes mellitus with diabetic neuropathy, unspecified: Secondary | ICD-10-CM

## 2018-07-21 DIAGNOSIS — I4819 Other persistent atrial fibrillation: Secondary | ICD-10-CM | POA: Diagnosis not present

## 2018-07-21 DIAGNOSIS — IMO0002 Reserved for concepts with insufficient information to code with codable children: Secondary | ICD-10-CM

## 2018-07-21 NOTE — Progress Notes (Signed)
Cardiology Office Note:    Date:  07/21/2018   ID:  Joseph [name], DOB [birth year], MRN [id number]  PCP:  Angelina Sheriff, MD  Cardiologist:  Jenean Lindau, MD   Referring MD: Angelina Sheriff, MD    ASSESSMENT:    1. Benign essential hypertension   2. Coronary artery disease involving native coronary artery of native heart without angina pectoris   3. Moderate mitral regurgitation   4. Mixed hyperlipidemia   5. Type 2 diabetes, uncontrolled, with neuropathy (HCC)   6. Persistent atrial fibrillation    PLAN:    In order of problems listed above:  1. Secondary prevention stressed with the patient.  Importance of compliance with diet and medication stressed and he vocalized understanding.  His blood pressure is stable. 2. I discussed with the patient atrial fibrillation, disease process. Management and therapy including rate and rhythm control, anticoagulation benefits and potential risks were discussed extensively with the patient. Patient had multiple questions which were answered to patient's satisfaction. 3. I will do 48-hour Holter monitor to assess his heart rates. 4. Mitral regurgitation issues are clinically stable.  I reviewed the echo report again 5. Patient will be seen in follow-up appointment in 6 months or earlier if the patient has any concerns    Medication Adjustments/Labs and Tests Ordered: Current medicines are reviewed at length with the patient today.  Concerns regarding medicines are outlined above.  No orders of the defined types were placed in this encounter.  No orders of the defined types were placed in this encounter.    No chief complaint on file.    History of Present Illness:    Joseph [name] is a 67 y.o. male.  Patient has history of atrial fibrillation and moderate mitral regurgitation.  He denies any problems at this time and takes care of activities of daily living.  No chest pain orthopnea or PND.  He has significant orthopedic  issues which is why he leads a sedentary lifestyle.  He complains of palpitations at times and wonders if his heart rate might be working faster than it should be.  Past Medical History:  Diagnosis Date  . Atrial fibrillation ([place])   . CAD (coronary artery disease)   . Diabetes mellitus ([place])   . Essential hypertension   . Pure hypercholesterolemia     Past Surgical History:  Procedure Laterality Date  . ABLATION OF DYSRHYTHMIC FOCUS    . big toe removed    . cardiac catheterization    . CORONARY STENT PLACEMENT    . tonsillectomy      Current Medications: Current Meds  Medication Sig  . acetaminophen (TYLENOL) 500 MG tablet Take 500 mg by mouth every 4 (four) hours as needed.   Marland Kitchen apixaban (ELIQUIS) 5 MG TABS tablet Take 1 tablet (5 mg total) by mouth 2 (two) times daily.  Marland Kitchen aspirin EC 81 MG tablet Take 81 mg by mouth daily.  Marland Kitchen atorvastatin (LIPITOR) 40 MG tablet Take 1 tablet (40 mg total) by mouth daily.  . Cyanocobalamin (VITAMIN B 12 PO) Take 1,000 mg by mouth 2 (two) times daily.  Marland Kitchen gabapentin (NEURONTIN) 300 MG capsule Take 300 mg by mouth daily.  . insulin NPH Human (HUMULIN N,NOVOLIN N) 100 UNIT/ML injection Inject into the skin.  Marland Kitchen insulin regular (NOVOLIN R,HUMULIN R) 100 units/mL injection Inject into the skin 3 (three) times daily before meals.  Marland Kitchen lisinopril (PRINIVIL,ZESTRIL) 20 MG tablet Take 20 mg by mouth daily.  Marland Kitchen  metoprolol tartrate (LOPRESSOR) 50 MG tablet Take 50 mg by mouth 2 (two) times daily.     Allergies:   Patient has no known allergies.   Social History   Socioeconomic History  . Marital status: Unknown    Spouse name: Not on file  . Number of children: Not on file  . Years of education: Not on file  . Highest education level: Not on file  Occupational History  . Not on file  Social Needs  . Financial resource strain: Not on file  . Food insecurity:    Worry: Not on file    Inability: Not on file  . Transportation needs:    Medical: Not  on file    Non-medical: Not on file  Tobacco Use  . Smoking status: Former Research scientist (life sciences)  . Smokeless tobacco: Never Used  Substance and Sexual Activity  . Alcohol use: Yes  . Drug use: No  . Sexual activity: Not on file  Lifestyle  . Physical activity:    Days per week: Not on file    Minutes per session: Not on file  . Stress: Not on file  Relationships  . Social connections:    Talks on phone: Not on file    Gets together: Not on file    Attends religious service: Not on file    Active member of club or organization: Not on file    Attends meetings of clubs or organizations: Not on file    Relationship status: Not on file  Other Topics Concern  . Not on file  Social History Narrative  . Not on file     Family History: The patient's family history includes Cancer in his father; Heart disease in his mother.  ROS:   Please see the history of present illness.    All other systems reviewed and are negative.  EKGs/Labs/Other Studies Reviewed:    The following studies were reviewed today: I discussed my findings with the patient at extensive length   Recent Labs: No results found for requested labs within last 8760 hours.  Recent Lipid Panel No results found for: CHOL, TRIG, HDL, CHOLHDL, VLDL, LDLCALC, LDLDIRECT  Physical Exam:    VS:  BP 140/82 (BP Location: Right Arm, Patient Position: Sitting, Cuff Size: Normal)   Pulse 75   Ht 5\' 10"  (1.778 m)   Wt 202 lb 9.6 oz (91.9 kg)   SpO2 98%   BMI 29.07 kg/m     Wt Readings from Last 3 Encounters:  07/21/18 202 lb 9.6 oz (91.9 kg)  03/25/18 196 lb (88.9 kg)  01/19/18 198 lb (89.8 kg)     GEN: Patient is in no acute distress HEENT: Normal NECK: No JVD; No carotid bruits LYMPHATICS: No lymphadenopathy CARDIAC: Hear sounds regular, 2/6 systolic murmur at the apex. RESPIRATORY:  Clear to auscultation without rales, wheezing or rhonchi  ABDOMEN: Soft, non-tender, non-distended MUSCULOSKELETAL:  No edema; No deformity    SKIN: Warm and dry NEUROLOGIC:  Alert and oriented x 3 PSYCHIATRIC:  Normal affect   Signed, Jenean Lindau, MD  07/21/2018 1:46 PM    [facility]

## 2018-07-21 NOTE — Patient Instructions (Signed)
Medication Instructions:  Your physician recommends that you continue on your current medications as directed. Please refer to the Current Medication list given to you today.  If you need a refill on your cardiac medications before your next appointment, please call your pharmacy.   Lab work: None  If you have labs (blood work) drawn today and your tests are completely normal, you will receive your results only by: Marland Kitchen MyChart Message (if you have MyChart) OR . A paper copy in the mail If you have any lab test that is abnormal or we need to change your treatment, we will call you to review the results.  Testing/Procedures: Your physician has recommended that you wear a holter monitor. Holter monitors are medical devices that record the heart's electrical activity. Doctors most often use these monitors to diagnose arrhythmias. Arrhythmias are problems with the speed or rhythm of the heartbeat. The monitor is a small, portable device. You can wear one while you do your normal daily activities. This is usually used to diagnose what is causing palpitations/syncope (passing out).  Follow-Up: At [facility], you and your health needs are our priority.  As part of our continuing mission to provide you with exceptional heart care, we have created designated Provider Care Teams.  These Care Teams include your primary Cardiologist (physician) and Advanced Practice Providers (APPs -  Physician Assistants and Nurse Practitioners) who all work together to provide you with the care you need, when you need it.  You will need a follow up appointment in 6 months.  Please call our office 2 months in advance to schedule this appointment.  You may see another member of our [employer] Provider Team in [place]: Jenne Campus, MD . Shirlee More, MD  Any Other Special Instructions Will Be Listed Below (If Applicable).      [place], RN, BSN

## 2018-07-23 ENCOUNTER — Telehealth: Payer: Self-pay | Admitting: *Deleted

## 2018-07-23 NOTE — Telephone Encounter (Signed)
Left message on phone for pt letting him know that we got samples of Eliquis 5mg  in the [facility] office.

## 2018-08-07 DIAGNOSIS — E079 Disorder of thyroid, unspecified: Secondary | ICD-10-CM | POA: Diagnosis not present

## 2018-08-07 DIAGNOSIS — E7800 Pure hypercholesterolemia, unspecified: Secondary | ICD-10-CM | POA: Diagnosis not present

## 2018-08-07 DIAGNOSIS — D126 Benign neoplasm of colon, unspecified: Secondary | ICD-10-CM | POA: Diagnosis not present

## 2018-08-07 DIAGNOSIS — K6389 Other specified diseases of intestine: Secondary | ICD-10-CM | POA: Diagnosis not present

## 2018-08-07 DIAGNOSIS — Z7901 Long term (current) use of anticoagulants: Secondary | ICD-10-CM | POA: Diagnosis not present

## 2018-08-07 DIAGNOSIS — J439 Emphysema, unspecified: Secondary | ICD-10-CM | POA: Diagnosis not present

## 2018-08-07 DIAGNOSIS — Z955 Presence of coronary angioplasty implant and graft: Secondary | ICD-10-CM | POA: Diagnosis not present

## 2018-08-07 DIAGNOSIS — Z86010 Personal history of colonic polyps: Secondary | ICD-10-CM | POA: Diagnosis not present

## 2018-08-07 DIAGNOSIS — Z951 Presence of aortocoronary bypass graft: Secondary | ICD-10-CM | POA: Diagnosis not present

## 2018-08-07 DIAGNOSIS — Z1211 Encounter for screening for malignant neoplasm of colon: Secondary | ICD-10-CM | POA: Diagnosis not present

## 2018-08-07 DIAGNOSIS — Z87891 Personal history of nicotine dependence: Secondary | ICD-10-CM | POA: Diagnosis not present

## 2018-08-07 DIAGNOSIS — E11319 Type 2 diabetes mellitus with unspecified diabetic retinopathy without macular edema: Secondary | ICD-10-CM | POA: Diagnosis not present

## 2018-08-07 DIAGNOSIS — Z800 Family history of malignant neoplasm of digestive organs: Secondary | ICD-10-CM | POA: Diagnosis not present

## 2018-08-07 DIAGNOSIS — Z79899 Other long term (current) drug therapy: Secondary | ICD-10-CM | POA: Diagnosis not present

## 2018-08-07 DIAGNOSIS — Z7982 Long term (current) use of aspirin: Secondary | ICD-10-CM | POA: Diagnosis not present

## 2018-08-07 DIAGNOSIS — I252 Old myocardial infarction: Secondary | ICD-10-CM | POA: Diagnosis not present

## 2018-08-07 DIAGNOSIS — K6289 Other specified diseases of anus and rectum: Secondary | ICD-10-CM | POA: Diagnosis not present

## 2018-08-07 DIAGNOSIS — J449 Chronic obstructive pulmonary disease, unspecified: Secondary | ICD-10-CM | POA: Diagnosis not present

## 2018-08-07 DIAGNOSIS — Z794 Long term (current) use of insulin: Secondary | ICD-10-CM | POA: Diagnosis not present

## 2018-08-07 DIAGNOSIS — Z8711 Personal history of peptic ulcer disease: Secondary | ICD-10-CM | POA: Diagnosis not present

## 2018-08-07 DIAGNOSIS — I2510 Atherosclerotic heart disease of native coronary artery without angina pectoris: Secondary | ICD-10-CM | POA: Diagnosis not present

## 2018-08-07 DIAGNOSIS — E119 Type 2 diabetes mellitus without complications: Secondary | ICD-10-CM | POA: Diagnosis not present

## 2018-08-07 DIAGNOSIS — K621 Rectal polyp: Secondary | ICD-10-CM | POA: Diagnosis not present

## 2018-08-07 DIAGNOSIS — I10 Essential (primary) hypertension: Secondary | ICD-10-CM | POA: Diagnosis not present

## 2018-08-10 ENCOUNTER — Encounter (INDEPENDENT_AMBULATORY_CARE_PROVIDER_SITE_OTHER): Payer: PPO

## 2018-08-10 DIAGNOSIS — H35373 Puckering of macula, bilateral: Secondary | ICD-10-CM | POA: Diagnosis not present

## 2018-08-10 DIAGNOSIS — Z794 Long term (current) use of insulin: Secondary | ICD-10-CM | POA: Diagnosis not present

## 2018-08-10 DIAGNOSIS — E113293 Type 2 diabetes mellitus with mild nonproliferative diabetic retinopathy without macular edema, bilateral: Secondary | ICD-10-CM | POA: Diagnosis not present

## 2018-08-10 DIAGNOSIS — H35369 Drusen (degenerative) of macula, unspecified eye: Secondary | ICD-10-CM | POA: Diagnosis not present

## 2018-08-10 DIAGNOSIS — I4819 Other persistent atrial fibrillation: Secondary | ICD-10-CM | POA: Diagnosis not present

## 2018-08-19 ENCOUNTER — Telehealth: Payer: Self-pay

## 2018-08-19 DIAGNOSIS — E1165 Type 2 diabetes mellitus with hyperglycemia: Secondary | ICD-10-CM | POA: Diagnosis not present

## 2018-08-19 DIAGNOSIS — Z89412 Acquired absence of left great toe: Secondary | ICD-10-CM | POA: Diagnosis not present

## 2018-08-19 DIAGNOSIS — E11610 Type 2 diabetes mellitus with diabetic neuropathic arthropathy: Secondary | ICD-10-CM | POA: Diagnosis not present

## 2018-08-19 DIAGNOSIS — L84 Corns and callosities: Secondary | ICD-10-CM | POA: Diagnosis not present

## 2018-08-19 DIAGNOSIS — E1140 Type 2 diabetes mellitus with diabetic neuropathy, unspecified: Secondary | ICD-10-CM | POA: Diagnosis not present

## 2018-08-19 DIAGNOSIS — B351 Tinea unguium: Secondary | ICD-10-CM | POA: Diagnosis not present

## 2018-08-19 DIAGNOSIS — Z794 Long term (current) use of insulin: Secondary | ICD-10-CM | POA: Diagnosis not present

## 2018-08-19 NOTE — Telephone Encounter (Signed)
Left voicemail for the patient to call the office regarding results and needing an appointment.

## 2018-08-20 ENCOUNTER — Encounter: Payer: Self-pay | Admitting: Cardiology

## 2018-08-20 ENCOUNTER — Ambulatory Visit (INDEPENDENT_AMBULATORY_CARE_PROVIDER_SITE_OTHER): Payer: PPO | Admitting: Cardiology

## 2018-08-20 VITALS — BP 132/76 | HR 51 | Ht 70.0 in | Wt 205.0 lb

## 2018-08-20 DIAGNOSIS — I4891 Unspecified atrial fibrillation: Secondary | ICD-10-CM | POA: Diagnosis not present

## 2018-08-20 DIAGNOSIS — E782 Mixed hyperlipidemia: Secondary | ICD-10-CM

## 2018-08-20 DIAGNOSIS — I4892 Unspecified atrial flutter: Secondary | ICD-10-CM

## 2018-08-20 DIAGNOSIS — E11610 Type 2 diabetes mellitus with diabetic neuropathic arthropathy: Secondary | ICD-10-CM

## 2018-08-20 DIAGNOSIS — I10 Essential (primary) hypertension: Secondary | ICD-10-CM

## 2018-08-20 DIAGNOSIS — IMO0002 Reserved for concepts with insufficient information to code with codable children: Secondary | ICD-10-CM

## 2018-08-20 DIAGNOSIS — I2510 Atherosclerotic heart disease of native coronary artery without angina pectoris: Secondary | ICD-10-CM

## 2018-08-20 NOTE — Patient Instructions (Signed)
Medication Instructions:  Your physician recommends that you continue on your current medications as directed. Please refer to the Current Medication list given to you today.  If you need a refill on your cardiac medications before your next appointment, please call your pharmacy.   Lab work: None  If you have labs (blood work) drawn today and your tests are completely normal, you will receive your results only by: Marland Kitchen MyChart Message (if you have MyChart) OR . A paper copy in the mail If you have any lab test that is abnormal or we need to change your treatment, we will call you to review the results.  Testing/Procedures: None  Follow-Up: At [facility], you and your health needs are our priority.  As part of our continuing mission to provide you with exceptional heart care, we have created designated Provider Care Teams.  These Care Teams include your primary Cardiologist (physician) and Advanced Practice Providers (APPs -  Physician Assistants and Nurse Practitioners) who all work together to provide you with the care you need, when you need it.  You will need a follow up appointment in 6 months.  Please call our office 2 months in advance to schedule this appointment.  You may see another member of our [employer] Provider Team in [place]: Jenne Campus, MD . Shirlee More, MD  Any Other Special Instructions Will Be Listed Below (If Applicable).   Referral Orders     Ambulatory referral to Cardiac Electrophysiology

## 2018-08-20 NOTE — Progress Notes (Signed)
Cardiology Office Note:    Date:  08/20/2018   ID:  [name], DOB 15-Aug-1951, MRN [id number]  PCP:  Angelina Sheriff, MD  Cardiologist:  Jenean Lindau, MD   Referring MD: Angelina Sheriff, MD    ASSESSMENT:    1. Atrial fibrillation/flutter ([place])   2. Benign essential hypertension   3. Coronary artery disease involving native coronary artery of native heart without angina pectoris   4. Mixed hyperlipidemia   5. Diabetic Charcot foot ([place])    PLAN:    In order of problems listed above:  1. Secondary prevention stressed with the patient.  Importance of compliance with diet and medication stressed and he vocalized understanding.  His blood pressure is stable.  Diet was discussed with dyslipidemia and diabetes mellitus. 2. I discussed with the patient atrial fibrillation, disease process. Management and therapy including rate and rhythm control, anticoagulation benefits and potential risks were discussed extensively with the patient. Patient had multiple questions which were answered to patient's satisfaction. 3. In view of Holter monitor findings I would like to refer him to my colleague Dr. Rayann Heman for the possibility of electrophysiology intervention and ablation.  Hopefully this will either cured him of atrial flutter and may also help reduce recurrences of tachyarrhythmias.  He is on anticoagulation appropriately.  I would leave these complex decisions to the expertise of our electrophysiology colleague. 4. Patient will be seen in follow-up appointment in 6 months or earlier if the patient has any concerns    Medication Adjustments/Labs and Tests Ordered: Current medicines are reviewed at length with the patient today.  Concerns regarding medicines are outlined above.  No orders of the defined types were placed in this encounter.  No orders of the defined types were placed in this encounter.    No chief complaint on file.    History of Present Illness:    Joseph [name] is a 67 y.o. male.  The patient has history of atrial fibrillation, coronary artery disease, essential hypertension and diabetes mellitus on insulin.  He was evaluated because of palpitations and his Holter monitor reveals a rhythm suggesting of atrial flutter with fairly controlled ventricular rate.  Patient mentions to me that about in the timeframe of 2010 or 2011 he had atrial flutter ablation at [facility].  He denies any problems at this time.  No chest pain orthopnea or PND.  He does have paroxysms of fast atrial rates for which sometimes he gets admitted to the hospital.  Past Medical History:  Diagnosis Date  . Atrial fibrillation ([place])   . CAD (coronary artery disease)   . Diabetes mellitus ([place])   . Essential hypertension   . Pure hypercholesterolemia     Past Surgical History:  Procedure Laterality Date  . ABLATION OF DYSRHYTHMIC FOCUS    . big toe removed    . cardiac catheterization    . CORONARY STENT PLACEMENT    . tonsillectomy      Current Medications: Current Meds  Medication Sig  . acetaminophen (TYLENOL) 500 MG tablet Take 500 mg by mouth every 4 (four) hours as needed.   Marland Kitchen apixaban (ELIQUIS) 5 MG TABS tablet Take 1 tablet (5 mg total) by mouth 2 (two) times daily.  Marland Kitchen aspirin EC 81 MG tablet Take 81 mg by mouth daily.  Marland Kitchen atorvastatin (LIPITOR) 40 MG tablet Take 1 tablet (40 mg total) by mouth daily.  . Cyanocobalamin (VITAMIN B 12 PO) Take 1,000 mg by  mouth 2 (two) times daily.  Marland Kitchen gabapentin (NEURONTIN) 300 MG capsule Take 300 mg by mouth daily.  . insulin NPH Human (HUMULIN N,NOVOLIN N) 100 UNIT/ML injection Inject into the skin.  Marland Kitchen insulin regular (NOVOLIN R,HUMULIN R) 100 units/mL injection Inject into the skin 3 (three) times daily before meals.  Marland Kitchen lisinopril (PRINIVIL,ZESTRIL) 20 MG tablet Take 20 mg by mouth daily.  . metoprolol tartrate (LOPRESSOR) 50 MG tablet Take 50 mg by mouth 2 (two) times daily.     Allergies:   Patient has  no known allergies.   Social History   Socioeconomic History  . Marital status: Unknown    Spouse name: Not on file  . Number of children: Not on file  . Years of education: Not on file  . Highest education level: Not on file  Occupational History  . Not on file  Social Needs  . Financial resource strain: Not on file  . Food insecurity:    Worry: Not on file    Inability: Not on file  . Transportation needs:    Medical: Not on file    Non-medical: Not on file  Tobacco Use  . Smoking status: Former Research scientist (life sciences)  . Smokeless tobacco: Never Used  Substance and Sexual Activity  . Alcohol use: Yes  . Drug use: No  . Sexual activity: Not on file  Lifestyle  . Physical activity:    Days per week: Not on file    Minutes per session: Not on file  . Stress: Not on file  Relationships  . Social connections:    Talks on phone: Not on file    Gets together: Not on file    Attends religious service: Not on file    Active member of club or organization: Not on file    Attends meetings of clubs or organizations: Not on file    Relationship status: Not on file  Other Topics Concern  . Not on file  Social History Narrative  . Not on file     Family History: The patient's family history includes Cancer in his father; Heart disease in his mother.  ROS:   Please see the history of present illness.    All other systems reviewed and are negative.  EKGs/Labs/Other Studies Reviewed:    The following studies were reviewed today: I discussed the Holter monitor findings with the patient at length.   Recent Labs: No results found for requested labs within last 8760 hours.  Recent Lipid Panel No results found for: CHOL, TRIG, HDL, CHOLHDL, VLDL, LDLCALC, LDLDIRECT  Physical Exam:    VS:  BP 132/76 (BP Location: Right Arm, Patient Position: Sitting, Cuff Size: Normal)   Pulse (!) 51   Ht 5\' 10"  (1.778 m)   Wt 205 lb (93 kg)   SpO2 97%   BMI 29.41 kg/m     Wt Readings from Last 3  Encounters:  08/20/18 205 lb (93 kg)  07/21/18 202 lb 9.6 oz (91.9 kg)  03/25/18 196 lb (88.9 kg)     GEN: Patient is in no acute distress HEENT: Normal NECK: No JVD; No carotid bruits LYMPHATICS: No lymphadenopathy CARDIAC: Hear sounds regular, 2/6 systolic murmur at the apex. RESPIRATORY:  Clear to auscultation without rales, wheezing or rhonchi  ABDOMEN: Soft, non-tender, non-distended MUSCULOSKELETAL:  No edema; No deformity  SKIN: Warm and dry NEUROLOGIC:  Alert and oriented x 3 PSYCHIATRIC:  Normal affect   Signed, Jenean Lindau, MD  08/20/2018 8:45 AM  [facility]

## 2018-08-20 NOTE — Addendum Note (Signed)
Addended by: Mattie Marlin on: 08/20/2018 08:50 AM   Modules accepted: Orders

## 2018-08-28 DIAGNOSIS — E1165 Type 2 diabetes mellitus with hyperglycemia: Secondary | ICD-10-CM | POA: Diagnosis not present

## 2018-08-28 DIAGNOSIS — E7800 Pure hypercholesterolemia, unspecified: Secondary | ICD-10-CM | POA: Diagnosis not present

## 2018-08-28 DIAGNOSIS — E1140 Type 2 diabetes mellitus with diabetic neuropathy, unspecified: Secondary | ICD-10-CM | POA: Diagnosis not present

## 2018-08-28 DIAGNOSIS — I10 Essential (primary) hypertension: Secondary | ICD-10-CM | POA: Diagnosis not present

## 2018-08-28 DIAGNOSIS — E041 Nontoxic single thyroid nodule: Secondary | ICD-10-CM | POA: Diagnosis not present

## 2018-08-28 DIAGNOSIS — Z794 Long term (current) use of insulin: Secondary | ICD-10-CM | POA: Diagnosis not present

## 2018-08-31 ENCOUNTER — Ambulatory Visit: Payer: PPO | Admitting: Cardiology

## 2018-08-31 ENCOUNTER — Encounter: Payer: Self-pay | Admitting: Cardiology

## 2018-08-31 VITALS — BP 124/64 | HR 129 | Ht 70.0 in | Wt 206.0 lb

## 2018-08-31 DIAGNOSIS — I25810 Atherosclerosis of coronary artery bypass graft(s) without angina pectoris: Secondary | ICD-10-CM | POA: Diagnosis not present

## 2018-08-31 DIAGNOSIS — I4819 Other persistent atrial fibrillation: Secondary | ICD-10-CM

## 2018-08-31 DIAGNOSIS — E782 Mixed hyperlipidemia: Secondary | ICD-10-CM

## 2018-08-31 DIAGNOSIS — Z01812 Encounter for preprocedural laboratory examination: Secondary | ICD-10-CM

## 2018-08-31 DIAGNOSIS — I10 Essential (primary) hypertension: Secondary | ICD-10-CM

## 2018-08-31 NOTE — Addendum Note (Signed)
Addended by: Eulis Foster on: 08/31/2018 04:50 PM   Modules accepted: Orders

## 2018-08-31 NOTE — Patient Instructions (Addendum)
Medication Instructions:  Your physician recommends that you continue on your current medications as directed. Please refer to the Current Medication list given to you today.  If you need a refill on your cardiac medications before your next appointment, please call your pharmacy.   Lab work: Pre procedure labs today: BMP & CBC If you have labs (blood work) drawn today and your tests are completely normal, you will receive your results only by:  [place] (if you have MyChart) OR  A paper copy in the mail If you have any lab test that is abnormal or we need to change your treatment, we will call you to review the results.  Testing/Procedures: Your physician has requested that you have cardiac CT within 7 days PRIOR to your ablation. Cardiac computed tomography (CT) is a painless test that uses an x-ray machine to take clear, detailed pictures of your heart. For further information please visit [facility]. Please follow instruction listed below under special instructions area.  Your physician has recommended that you have an ablation. Catheter ablation is a medical procedure used to treat some cardiac arrhythmias (irregular heartbeats). During catheter ablation, a long, thin, flexible tube is put into a blood vessel in your groin (upper thigh), or neck. This tube is called an ablation catheter. It is then guided to your heart through the blood vessel. Radio frequency waves destroy small areas of heart tissue where abnormal heartbeats may cause an arrhythmia to start.   Instructions for your ablation: 1. Please arrive at the [facility], Main Entrance "A", of [facility] at 5:30 a.m. on 09/24/2018. 2. Do not eat or drink after midnight the night prior to the procedure. 3. Do not miss any doses of ELIQUIS prior to the morning of the procedure.  4. Do not take any medications the morning of the procedure. 5. Both of your groins will need to be shaved for this procedure  (if needed). We ask that you do this yourself at home 1-2 days prior to the procedure.  If you are unable/uncomfortable to do yourself, the hospital staff will shave you the day of your procedure (if needed). 6. Plan for an overnight stay in the hospital. 7. You will need someone to drive you home at discharge.   Follow-Up: Your physician recommends that you schedule a follow-up appointment in: 4 weeks, after your procedure on 09/23/18, with Roderic Palau in the AFib clinic.  Your physician recommends that you schedule a follow-up appointment in: 3 months, after your procedure on 09/23/2018, with Dr. Curt Bears.   Thank you for choosing [facility]!!   Trinidad Curet, RN [phone]  Any Other Special Instructions Will Be Listed Below (If Applicable).  CT INSTRUCTIONS Please arrive at the [facility] main entrance of [facility] at _______ AM (30-45 minutes prior to test start time)  [facility] [place], [place] [address] [phone]  Proceed to the [facility] Radiology Department (First Floor).  Please follow these instructions carefully (unless otherwise directed):  Hold all erectile dysfunction medications at least 48 hours prior to test.  On the Night Before the Test: . Be sure to Drink plenty of water. . Do not consume any caffeinated/decaffeinated beverages or chocolate 12 hours prior to your test. . Do not take any antihistamines 12 hours prior to your test. . If you take Metformin do not take 24 hours prior to test.  On the Day of the Test: . Drink plenty of water. Do  not drink any water within one hour of the test. . Do not eat any food 4 hours prior to the test. . You may take your regular medications prior to the test.  . Take your Metoprolol (Lopressor) two hours prior to test. . HOLD Furosemide/Hydrochlorothiazide morning of the test.  After the Test: . Drink plenty of water. . After receiving IV contrast, you  may experience a mild flushed feeling. This is normal. . On occasion, you may experience a mild rash up to 24 hours after the test. This is not dangerous. If this occurs, you can take Benadryl 25 mg and increase your fluid intake. . If you experience trouble breathing, this can be serious. If it is severe call 911 IMMEDIATELY. If it is mild, please call our office. . If you take any of these medications: Glipizide/Metformin, Avandament, Glucavance, please do not take 48 hours after completing test.     Cardiac Ablation Cardiac ablation is a procedure to disable (ablate) a small amount of heart tissue in very specific places. The heart has many electrical connections. Sometimes these connections are abnormal and can cause the heart to beat very fast or irregularly. Ablating some of the problem areas can improve the heart rhythm or return it to normal. Ablation may be done for people who:  Have Wolff-Parkinson-White syndrome.  Have fast heart rhythms (tachycardia).  Have taken medicines for an abnormal heart rhythm (arrhythmia) that were not effective or caused side effects.  Have a high-risk heartbeat that may be life-threatening.  During the procedure, a small incision is made in the neck or the groin, and a long, thin, flexible tube (catheter) is inserted into the incision and moved to the heart. Small devices (electrodes) on the tip of the catheter will send out electrical currents. A type of X-ray (fluoroscopy) will be used to help guide the catheter and to provide images of the heart. Tell a health care provider about:  Any allergies you have.  All medicines you are taking, including vitamins, herbs, eye drops, creams, and over-the-counter medicines.  Any problems you or family members have had with anesthetic medicines.  Any blood disorders you have.  Any surgeries you have had.  Any medical conditions you have, such as kidney failure.  Whether you are pregnant or may be  pregnant. What are the risks? Generally, this is a safe procedure. However, problems may occur, including:  Infection.  Bruising and bleeding at the catheter insertion site.  Bleeding into the chest, especially into the sac that surrounds the heart. This is a serious complication.  Stroke or blood clots.  Damage to other structures or organs.  Allergic reaction to medicines or dyes.  Need for a permanent pacemaker if the normal electrical system is damaged. A pacemaker is a small computer that sends electrical signals to the heart and helps your heart beat normally.  The procedure not being fully effective. This may not be recognized until months later. Repeat ablation procedures are sometimes required.  What happens before the procedure?  Follow instructions from your health care provider about eating or drinking restrictions.  Ask your health care provider about: ? Changing or stopping your regular medicines. This is especially important if you are taking diabetes medicines or blood thinners. ? Taking medicines such as aspirin and ibuprofen. These medicines can thin your blood. Do not take these medicines before your procedure if your health care provider instructs you not to.  Plan to have someone take you home from the  hospital or clinic.  If you will be going home right after the procedure, plan to have someone with you for 24 hours. What happens during the procedure?  To lower your risk of infection: ? Your health care team will wash or sanitize their hands. ? Your skin will be washed with soap. ? Hair may be removed from the incision area.  An IV tube will be inserted into one of your veins.  You will be given a medicine to help you relax (sedative).  The skin on your neck or groin will be numbed.  An incision will be made in your neck or your groin.  A needle will be inserted through the incision and into a large vein in your neck or groin.  A catheter will be  inserted into the needle and moved to your heart.  Dye may be injected through the catheter to help your surgeon see the area of the heart that needs treatment.  Electrical currents will be sent from the catheter to ablate heart tissue in desired areas. There are three types of energy that may be used to ablate heart tissue: ? Heat (radiofrequency energy). ? Laser energy. ? Extreme cold (cryoablation).  When the necessary tissue has been ablated, the catheter will be removed.  Pressure will be held on the catheter insertion area to prevent excessive bleeding.  A bandage (dressing) will be placed over the catheter insertion area. The procedure may vary among health care providers and hospitals. What happens after the procedure?  Your blood pressure, heart rate, breathing rate, and blood oxygen level will be monitored until the medicines you were given have worn off.  Your catheter insertion area will be monitored for bleeding. You will need to lie still for a few hours to ensure that you do not bleed from the catheter insertion area.  Do not drive for 24 hours or as long as directed by your health care provider. Summary  Cardiac ablation is a procedure to disable (ablate) a small amount of heart tissue in very specific places. Ablating some of the problem areas can improve the heart rhythm or return it to normal.  During the procedure, electrical currents will be sent from the catheter to ablate heart tissue in desired areas. This information is not intended to replace advice given to you by your health care provider. Make sure you discuss any questions you have with your health care provider. Document Released: 02/16/2009 Document Revised: 08/19/2016 Document Reviewed: 08/19/2016 Elsevier Interactive Patient Education  [employer].

## 2018-08-31 NOTE — Progress Notes (Signed)
Electrophysiology Office Note   Date:  08/31/2018   ID:  Joseph [name], DOB August 23, 1951, MRN [id number]  PCP:  Angelina Sheriff, MD  Cardiologist:  Joseph [name] Primary Electrophysiologist:  Joseph [name] Joseph Leeds, MD    No chief complaint on file.    History of Present Illness: Joseph [name] is a 67 y.o. male who is being seen today for the evaluation of atrial fibrillation/flutter at the request of Joseph [name] Joseph [name]. Presenting today for electrophysiology evaluation.  He has a history of atrial fibrillation, coronary artery disease, hypertension, and diabetes.  He wore a Holter monitor that showed atrial flutter.  Per the patient, he had an atrial flutter ablation between 2010 and 2011 at [facility].  His main complaint today is weakness, fatigue.  He also has some mild shortness of breath.  He is unclear as to whether or not the shortness of breath is due to his COPD for his atrial fibrillation.  Today, he denies symptoms of palpitations, chest pain, orthopnea, PND, lower extremity edema, claudication, dizziness, presyncope, syncope, bleeding, or neurologic sequela. The patient is tolerating medications without difficulties.    Past Medical History:  Diagnosis Date  . Atrial fibrillation ([place])   . CAD (coronary artery disease)   . Diabetes mellitus ([place])   . Essential hypertension   . Pure hypercholesterolemia    Past Surgical History:  Procedure Laterality Date  . ABLATION OF DYSRHYTHMIC FOCUS    . big toe removed    . cardiac catheterization    . CORONARY STENT PLACEMENT    . tonsillectomy       Current Outpatient Medications  Medication Sig Dispense Refill  . acetaminophen (TYLENOL) 500 MG tablet Take 500 mg by mouth every 4 (four) hours as needed.     Marland Kitchen apixaban (ELIQUIS) 5 MG TABS tablet Take 1 tablet (5 mg total) by mouth 2 (two) times daily. 180 tablet 3  . aspirin EC 81 MG tablet Take 81 mg by mouth daily.    Marland Kitchen atorvastatin (LIPITOR) 40 MG tablet Take 1  tablet (40 mg total) by mouth daily. 90 tablet 3  . Cyanocobalamin (VITAMIN B 12 PO) Take 1,000 mg by mouth 2 (two) times daily.    Marland Kitchen gabapentin (NEURONTIN) 300 MG capsule Take 300 mg by mouth daily.    . insulin NPH Human (HUMULIN N,NOVOLIN N) 100 UNIT/ML injection Inject into the skin.    Marland Kitchen insulin regular (NOVOLIN R,HUMULIN R) 100 units/mL injection Inject into the skin 3 (three) times daily before meals.    Marland Kitchen lisinopril (PRINIVIL,ZESTRIL) 20 MG tablet Take 20 mg by mouth daily.    . metoprolol tartrate (LOPRESSOR) 50 MG tablet Take 50 mg by mouth 2 (two) times daily.    . nitroGLYCERIN (NITROSTAT) 0.4 MG SL tablet Place 1 tablet (0.4 mg total) under the tongue every 5 (five) minutes as needed for chest pain. 25 tablet 11   No current facility-administered medications for this visit.     Allergies:   Patient has no known allergies.   Social History:  The patient  reports that he has quit smoking. He has never used smokeless tobacco. He reports that he drinks alcohol. He reports that he does not use drugs.   Family History:  The patient's family history includes Cancer in his father; Heart disease in his mother.    ROS:  Please see the history of present illness.   Otherwise, review of systems is positive for leg swelling, palpitations.   All  other systems are reviewed and negative.    PHYSICAL EXAM: VS:  BP 124/64   Pulse (!) 129   Ht 5\' 10"  (1.778 m)   Wt 206 lb (93.4 kg)   BMI 29.56 kg/m  , BMI Body mass index is 29.56 kg/m. GEN: Well nourished, well developed, in no acute distress  HEENT: normal  Neck: no JVD, carotid bruits, or masses Cardiac: iRRR; no murmurs, rubs, or gallops,no edema  Respiratory:  clear to auscultation bilaterally, normal work of breathing GI: soft, nontender, nondistended, + BS MS: no deformity or atrophy  Skin: warm and dry Neuro:  Strength and sensation are intact Psych: euthymic mood, full affect  EKG:  EKG is ordered today. Personal review of  the ekg ordered shows atrial fibrillation, rate 129, inferior posterior Q waves  Recent Labs: No results found for requested labs within last 8760 hours.    Lipid Panel  No results found for: CHOL, TRIG, HDL, CHOLHDL, VLDL, LDLCALC, LDLDIRECT   Wt Readings from Last 3 Encounters:  08/31/18 206 lb (93.4 kg)  08/20/18 205 lb (93 kg)  07/21/18 202 lb 9.6 oz (91.9 kg)      Other studies Reviewed: Additional studies/ records that were reviewed today include: Holter 08/19/18 - personally reviewed Review of the above records today demonstrates:  Minimum heart rate: 82 BPM.  Average heart rate: 111 BPM.  Maximal heart rate 146 BPM. Atrial arrhythmia: Rhythm appeared to be atrial flutter throughout the study  TTE 03/12/2018 Mild concentric LVH Ejection fraction 55 to 60% Pseudo-normal LV filling pattern Moderately dilated left atrium Moderate to severe mitral regurgitation Mild tricuspid regurgitation   ASSESSMENT AND PLAN:  1.  Paroxysmal atrial fibrillation/flutter: Has had an ablation for likely atrial fibrillation in 2010 or 2011.  He is in atrial fibrillation but has had documented atrial flutter as well.  I discussed with him the possibility of medical management versus repeat ablation.  He is not a good candidate for amiodarone, he has coronary artery disease and thus flecainide would be less ideal.  He does have financial constraints and thus would prefer not to be on dofetilide.  We Nakota Elsen plan for ablation.  Risks and benefits include bleeding, tamponade, heart block, stroke, damage to surrounding organs.  He understands these risks and is agreed to the procedure.  This patients CHA2DS2-VASc Score and unadjusted Ischemic Stroke Rate (% per year) is equal to 4.8 % stroke rate/year from a score of 4  Above score calculated as 1 point each if present [CHF, HTN, DM, Vascular=MI/PAD/Aortic Plaque, Age if 65-74, or Male] Above score calculated as 2 points each if present [Age > 75,  or Stroke/TIA/TE]  2.  Hypertension: Well-controlled today.  No changes.  3.  Coronary artery disease: No current chest pain.  Status post CABG.  4.  Hyperlipidemia: Continue atorvastatin   Current medicines are reviewed at length with the patient today.   The patient does not have concerns regarding his medicines.  The following changes were made today:  none  Labs/ tests ordered today include:  Orders Placed This Encounter  Procedures  . CT CARDIAC MORPH/PULM VEIN W/CM&W/O CA SCORE  . CT CORONARY FRACTIONAL FLOW RESERVE DATA PREP  . CT CORONARY FRACTIONAL FLOW RESERVE FLUID ANALYSIS  . Basic metabolic panel  . CBC  . EKG 12-Lead   Case discussed with primary cardiology  Disposition:   FU with Maryanna Stuber 3 months  Signed, Sabrina Arriaga Joseph Leeds, MD  08/31/2018 4:17 PM  [place] [address] [phone] (office) [phone] (fax)

## 2018-09-01 LAB — CBC
Hematocrit: 47 % (ref 37.5–51.0)
Hemoglobin: 15.4 g/dL (ref 13.0–17.7)
MCH: 28.6 pg (ref 26.6–33.0)
MCHC: 32.8 g/dL (ref 31.5–35.7)
MCV: 87 fL (ref 79–97)
Platelets: 216 10*3/uL (ref 150–450)
RBC: 5.38 x10E6/uL (ref 4.14–5.80)
RDW: 13 % (ref 12.3–15.4)
WBC: 8.5 10*3/uL (ref 3.4–10.8)

## 2018-09-01 LAB — BASIC METABOLIC PANEL
BUN / CREAT RATIO: 20 (ref 10–24)
BUN: 34 mg/dL — AB (ref 8–27)
CO2: 21 mmol/L (ref 20–29)
CREATININE: 1.69 mg/dL — AB (ref 0.76–1.27)
Calcium: 8.7 mg/dL (ref 8.6–10.2)
Chloride: 106 mmol/L (ref 96–106)
GFR, EST AFRICAN AMERICAN: 48 mL/min/{1.73_m2} — AB (ref >59)
GFR, EST NON AFRICAN AMERICAN: 41 mL/min/{1.73_m2} — AB (ref >59)
Glucose: 93 mg/dL (ref 65–99)
Potassium: 4.7 mmol/L (ref 3.5–5.2)
Sodium: 144 mmol/L (ref 134–144)

## 2018-09-17 ENCOUNTER — Ambulatory Visit (HOSPITAL_COMMUNITY)
Admission: RE | Admit: 2018-09-17 | Discharge: 2018-09-17 | Disposition: A | Discharge status: Home / Self Care | Payer: PPO | Source: Ambulatory Visit | Attending: Cardiology | Admitting: Cardiology

## 2018-09-17 DIAGNOSIS — I4819 Other persistent atrial fibrillation: Secondary | ICD-10-CM | POA: Diagnosis not present

## 2018-09-17 MED ORDER — METOPROLOL TARTRATE 5 MG/5ML IV SOLN
INTRAVENOUS | Status: AC
  Filled 2018-09-17: qty 20

## 2018-09-17 MED ORDER — METOPROLOL TARTRATE 5 MG/5ML IV SOLN
5.0000 mg | INTRAVENOUS | Status: DC | PRN
  Administered 2018-09-17 (×3): 5 mg via INTRAVENOUS
  Filled 2018-09-17 (×2): qty 5

## 2018-09-17 MED ORDER — IOPAMIDOL (ISOVUE-370) INJECTION 76%
100.0000 mL | Freq: Once | INTRAVENOUS | Status: AC | PRN
  Administered 2018-09-17: 100 mL via INTRAVENOUS

## 2018-09-17 MED ORDER — NITROGLYCERIN 0.4 MG SL SUBL
SUBLINGUAL_TABLET | SUBLINGUAL | Status: AC
  Filled 2018-09-17: qty 2

## 2018-09-17 MED ORDER — NITROGLYCERIN 0.4 MG SL SUBL
0.8000 mg | SUBLINGUAL_TABLET | SUBLINGUAL | Status: DC | PRN
  Filled 2018-09-17: qty 25

## 2018-09-24 ENCOUNTER — Ambulatory Visit (HOSPITAL_COMMUNITY)
Admission: RE | Admit: 2018-09-24 | Discharge: 2018-09-24 | Disposition: A | Discharge status: Home / Self Care | Payer: PPO | Source: Ambulatory Visit | Attending: Cardiology | Admitting: Cardiology

## 2018-09-24 ENCOUNTER — Ambulatory Visit (HOSPITAL_COMMUNITY): Payer: PPO | Admitting: Anesthesiology

## 2018-09-24 ENCOUNTER — Other Ambulatory Visit: Payer: Self-pay

## 2018-09-24 ENCOUNTER — Encounter (HOSPITAL_COMMUNITY)
Admission: RE | Disposition: A | Discharge status: Home / Self Care | Payer: Self-pay | Source: Ambulatory Visit | Attending: Cardiology

## 2018-09-24 ENCOUNTER — Encounter (HOSPITAL_COMMUNITY): Payer: Self-pay | Admitting: Anesthesiology

## 2018-09-24 DIAGNOSIS — Z89419 Acquired absence of unspecified great toe: Secondary | ICD-10-CM | POA: Insufficient documentation

## 2018-09-24 DIAGNOSIS — I4819 Other persistent atrial fibrillation: Secondary | ICD-10-CM

## 2018-09-24 DIAGNOSIS — Z87891 Personal history of nicotine dependence: Secondary | ICD-10-CM | POA: Insufficient documentation

## 2018-09-24 DIAGNOSIS — I2510 Atherosclerotic heart disease of native coronary artery without angina pectoris: Secondary | ICD-10-CM | POA: Diagnosis not present

## 2018-09-24 DIAGNOSIS — Z7982 Long term (current) use of aspirin: Secondary | ICD-10-CM | POA: Insufficient documentation

## 2018-09-24 DIAGNOSIS — Z8249 Family history of ischemic heart disease and other diseases of the circulatory system: Secondary | ICD-10-CM | POA: Insufficient documentation

## 2018-09-24 DIAGNOSIS — E7800 Pure hypercholesterolemia, unspecified: Secondary | ICD-10-CM | POA: Diagnosis not present

## 2018-09-24 DIAGNOSIS — I4891 Unspecified atrial fibrillation: Secondary | ICD-10-CM | POA: Diagnosis not present

## 2018-09-24 DIAGNOSIS — I10 Essential (primary) hypertension: Secondary | ICD-10-CM | POA: Diagnosis not present

## 2018-09-24 DIAGNOSIS — Z955 Presence of coronary angioplasty implant and graft: Secondary | ICD-10-CM | POA: Diagnosis not present

## 2018-09-24 DIAGNOSIS — E119 Type 2 diabetes mellitus without complications: Secondary | ICD-10-CM | POA: Diagnosis not present

## 2018-09-24 DIAGNOSIS — I483 Typical atrial flutter: Secondary | ICD-10-CM

## 2018-09-24 DIAGNOSIS — I4892 Unspecified atrial flutter: Secondary | ICD-10-CM | POA: Diagnosis not present

## 2018-09-24 DIAGNOSIS — Z79899 Other long term (current) drug therapy: Secondary | ICD-10-CM | POA: Insufficient documentation

## 2018-09-24 DIAGNOSIS — Z794 Long term (current) use of insulin: Secondary | ICD-10-CM | POA: Diagnosis not present

## 2018-09-24 DIAGNOSIS — J449 Chronic obstructive pulmonary disease, unspecified: Secondary | ICD-10-CM | POA: Diagnosis not present

## 2018-09-24 HISTORY — PX: ATRIAL FIBRILLATION ABLATION: EP1191

## 2018-09-24 LAB — POCT ACTIVATED CLOTTING TIME
ACTIVATED CLOTTING TIME: 340 s
Activated Clotting Time: 362 seconds
Activated Clotting Time: 406 seconds
Activated Clotting Time: 318 seconds
Activated Clotting Time: 125 seconds

## 2018-09-24 LAB — GLUCOSE, CAPILLARY
GLUCOSE-CAPILLARY: 216 mg/dL — AB (ref 70–99)
Glucose-Capillary: 86 mg/dL (ref 70–99)
Glucose-Capillary: 195 mg/dL — ABNORMAL HIGH (ref 70–99)

## 2018-09-24 SURGERY — ATRIAL FIBRILLATION ABLATION
Anesthesia: General

## 2018-09-24 MED ORDER — ROCURONIUM BROMIDE 10 MG/ML (PF) SYRINGE
PREFILLED_SYRINGE | INTRAVENOUS | Status: DC | PRN
  Administered 2018-09-24: 10 mg via INTRAVENOUS
  Administered 2018-09-24: 20 mg via INTRAVENOUS
  Administered 2018-09-24: 50 mg via INTRAVENOUS
  Administered 2018-09-24: 20 mg via INTRAVENOUS

## 2018-09-24 MED ORDER — HEPARIN SODIUM (PORCINE) 1000 UNIT/ML IJ SOLN
INTRAMUSCULAR | Status: AC
  Filled 2018-09-24: qty 1

## 2018-09-24 MED ORDER — HEPARIN (PORCINE) IN NACL 1000-0.9 UT/500ML-% IV SOLN
INTRAVENOUS | Status: AC
  Filled 2018-09-24: qty 500

## 2018-09-24 MED ORDER — ONDANSETRON HCL 4 MG/2ML IJ SOLN
INTRAMUSCULAR | Status: DC | PRN
  Administered 2018-09-24: 4 mg via INTRAVENOUS

## 2018-09-24 MED ORDER — MIDAZOLAM HCL 5 MG/5ML IJ SOLN
INTRAMUSCULAR | Status: DC | PRN
  Administered 2018-09-24: 2 mg via INTRAVENOUS

## 2018-09-24 MED ORDER — PHENYLEPHRINE 40 MCG/ML (10ML) SYRINGE FOR IV PUSH (FOR BLOOD PRESSURE SUPPORT)
PREFILLED_SYRINGE | INTRAVENOUS | Status: DC | PRN
  Administered 2018-09-24: 120 ug via INTRAVENOUS

## 2018-09-24 MED ORDER — PROPOFOL 10 MG/ML IV BOLUS
INTRAVENOUS | Status: DC | PRN
  Administered 2018-09-24: 80 mg via INTRAVENOUS

## 2018-09-24 MED ORDER — SODIUM CHLORIDE 0.9 % IV SOLN: 250.0000 mL | INTRAVENOUS | Status: DC | PRN

## 2018-09-24 MED ORDER — SODIUM CHLORIDE 0.9% FLUSH: 3.0000 mL | Freq: Two times a day (BID) | INTRAVENOUS | Status: DC

## 2018-09-24 MED ORDER — ONDANSETRON HCL 4 MG/2ML IJ SOLN: 4.0000 mg | Freq: Four times a day (QID) | INTRAMUSCULAR | Status: DC | PRN

## 2018-09-24 MED ORDER — SODIUM CHLORIDE 0.9% FLUSH: 3.0000 mL | INTRAVENOUS | Status: DC | PRN

## 2018-09-24 MED ORDER — HEPARIN SODIUM (PORCINE) 1000 UNIT/ML IJ SOLN
INTRAMUSCULAR | Status: DC | PRN
  Administered 2018-09-24: 1000 [IU] via INTRAVENOUS

## 2018-09-24 MED ORDER — ESMOLOL HCL 100 MG/10ML IV SOLN
INTRAVENOUS | Status: DC | PRN
  Administered 2018-09-24: 40 mg via INTRAVENOUS

## 2018-09-24 MED ORDER — ACETAMINOPHEN 325 MG PO TABS
ORAL_TABLET | ORAL | Status: AC
  Filled 2018-09-24: qty 2

## 2018-09-24 MED ORDER — DEXAMETHASONE SODIUM PHOSPHATE 10 MG/ML IJ SOLN
INTRAMUSCULAR | Status: DC | PRN
  Administered 2018-09-24: 10 mg via INTRAVENOUS

## 2018-09-24 MED ORDER — PROTAMINE SULFATE 10 MG/ML IV SOLN
INTRAVENOUS | Status: DC | PRN
  Administered 2018-09-24 (×2): 5 mg via INTRAVENOUS
  Administered 2018-09-24 (×4): 10 mg via INTRAVENOUS

## 2018-09-24 MED ORDER — SODIUM CHLORIDE 0.9 % IV SOLN
INTRAVENOUS | Status: DC
  Administered 2018-09-24 (×2): via INTRAVENOUS

## 2018-09-24 MED ORDER — DOBUTAMINE IN D5W 4-5 MG/ML-% IV SOLN
INTRAVENOUS | Status: AC
  Filled 2018-09-24: qty 250

## 2018-09-24 MED ORDER — BUPIVACAINE HCL (PF) 0.25 % IJ SOLN
INTRAMUSCULAR | Status: AC
  Filled 2018-09-24: qty 30

## 2018-09-24 MED ORDER — FENTANYL CITRATE (PF) 100 MCG/2ML IJ SOLN
INTRAMUSCULAR | Status: DC | PRN
  Administered 2018-09-24: 50 ug via INTRAVENOUS

## 2018-09-24 MED ORDER — LIDOCAINE 2% (20 MG/ML) 5 ML SYRINGE
INTRAMUSCULAR | Status: DC | PRN
  Administered 2018-09-24: 100 mg via INTRAVENOUS

## 2018-09-24 MED ORDER — DOBUTAMINE IN D5W 4-5 MG/ML-% IV SOLN
INTRAVENOUS | Status: DC | PRN
  Administered 2018-09-24: 20 ug/kg/min via INTRAVENOUS

## 2018-09-24 MED ORDER — BUPIVACAINE HCL (PF) 0.25 % IJ SOLN
INTRAMUSCULAR | Status: DC | PRN
  Administered 2018-09-24: 20 mL

## 2018-09-24 MED ORDER — SODIUM CHLORIDE 0.9 % IV SOLN
INTRAVENOUS | Status: DC | PRN
  Administered 2018-09-24: 25 ug/min via INTRAVENOUS

## 2018-09-24 MED ORDER — SUGAMMADEX SODIUM 200 MG/2ML IV SOLN
INTRAVENOUS | Status: DC | PRN
  Administered 2018-09-24: 200 mg via INTRAVENOUS

## 2018-09-24 MED ORDER — HEPARIN SODIUM (PORCINE) 1000 UNIT/ML IJ SOLN
INTRAMUSCULAR | Status: DC | PRN
  Administered 2018-09-24: 15000 [IU] via INTRAVENOUS

## 2018-09-24 MED ORDER — HEPARIN (PORCINE) IN NACL 1000-0.9 UT/500ML-% IV SOLN
INTRAVENOUS | Status: DC | PRN
  Administered 2018-09-24 (×5): 500 mL

## 2018-09-24 MED ORDER — ACETAMINOPHEN 325 MG PO TABS
650.0000 mg | ORAL_TABLET | ORAL | Status: DC | PRN
  Administered 2018-09-24: 650 mg via ORAL
  Filled 2018-09-24: qty 2

## 2018-09-24 SURGICAL SUPPLY — 19 items
BLANKET WARM UNDERBOD FULL ACC (MISCELLANEOUS) ×3 IMPLANT
CATH MAPPNG PENTARAY F 2-6-2MM (CATHETERS) ×1 IMPLANT
CATH SMTCH THERMOCOOL SF DF (CATHETERS) ×3 IMPLANT
CATH SOUNDSTAR ECO REPROCESSED (CATHETERS) ×3 IMPLANT
CATH WEBSTER BI DIR CS D-F CRV (CATHETERS) ×3 IMPLANT
COVER SWIFTLINK CONNECTOR (BAG) ×3 IMPLANT
PACK EP LATEX FREE (CUSTOM PROCEDURE TRAY) ×2
PACK EP LF (CUSTOM PROCEDURE TRAY) ×1 IMPLANT
PAD PRO RADIOLUCENT 2001M-C (PAD) ×3 IMPLANT
PATCH CARTO3 (PAD) ×3 IMPLANT
PENTARAY F 2-6-2MM (CATHETERS) ×3
SHEATH AVANTI 11F 11CM (SHEATH) ×3 IMPLANT
SHEATH BAYLIS SUREFLEX  M 8.5 (SHEATH) ×2
SHEATH BAYLIS SUREFLEX M 8.5 (SHEATH) ×1 IMPLANT
SHEATH BAYLIS TRANSSEPTAL 98CM (NEEDLE) ×3 IMPLANT
SHEATH PINNACLE 7F 10CM (SHEATH) ×3 IMPLANT
SHEATH PINNACLE 8F 10CM (SHEATH) ×6 IMPLANT
SHEATH PINNACLE 9F 10CM (SHEATH) ×6 IMPLANT
TUBING SMART ABLATE COOLFLOW (TUBING) ×3 IMPLANT

## 2018-09-24 NOTE — H&P (Signed)
Joseph [name] has presented today for surgery, with the diagnosis of atrial fibrillation.  The various methods of treatment have been discussed with the patient and family. After consideration of risks, benefits and other options for treatment, the patient has consented to  Procedure(s): Catheter ablation as a surgical intervention .  Risks include but not limited to bleeding, tamponade, heart block, stroke, damage to surrounding organs, among others. The patient's history has been reviewed, patient examined, no change in status, stable for surgery.  I have reviewed the patient's chart and labs.  Questions were answered to the patient's satisfaction.    Cherisse Carrell Curt Bears, MD 09/24/2018 7:07 AM

## 2018-09-24 NOTE — Progress Notes (Signed)
Site area: Left groin a 7 and 11 french venous sheath was removed  Site Prior to Removal:  Level 0  Pressure Applied For 20 MINUTES    Bedrest Beginning at 1230p  Manual:   Yes.    Patient Status During Pull:  stable  Post Pull Groin Site:  Level 0  Post Pull Instructions Given:  Yes.    Post Pull Pulses Present:  Yes.    Dressing Applied:  Yes.    Comments:  Bolus 200 cc for low BP

## 2018-09-24 NOTE — Discharge Instructions (Signed)
Post procedure care instructions No driving for 4 days. No lifting over 5 lbs for 1 week. No sexual activity for 1 week. You may return to work on 10/01/18. Keep procedure site clean & dry. If you notice increased pain, swelling, bleeding or pus, call/return!  You may shower, but no soaking baths/hot tubs/pools for 1 week.     Femoral Site Care Refer to this sheet in the next few weeks. These instructions provide you with information about caring for yourself after your procedure. Your health care provider may also give you more specific instructions. Your treatment has been planned according to current medical practices, but problems sometimes occur. Call your health care provider if you have any problems or questions after your procedure. What can I expect after the procedure? After your procedure, it is typical to have the following:  Bruising at the site that usually fades within 1-2 weeks.  Blood collecting in the tissue (hematoma) that may be painful to the touch. It should usually decrease in size and tenderness within 1-2 weeks.  Follow these instructions at home:  Take medicines only as directed by your health care provider.  You may shower 24-48 hours after the procedure or as directed by your health care provider. Remove the bandage (dressing) and gently wash the site with plain soap and water. Pat the area dry with a clean towel. Do not rub the site, because this may cause bleeding.  Do not take baths, swim, or use a hot tub until your health care provider approves.  Check your insertion site every day for redness, swelling, or drainage.  Do not apply powder or lotion to the site.  Limit use of stairs to twice a day for the first 2-3 days or as directed by your health care provider.  Do not squat for the first 2-3 days or as directed by your health care provider.  Do not lift over 10 lb (4.5 kg) for 5 days after your procedure or as directed by your health care  provider.  Ask your health care provider when it is okay to: ? Return to work or school. ? Resume usual physical activities or sports. ? Resume sexual activity.  Do not drive home if you are discharged the same day as the procedure. Have someone else drive you.  You may drive 24 hours after the procedure unless otherwise instructed by your health care provider.  Do not operate machinery or power tools for 24 hours after the procedure or as directed by your health care provider.  If your procedure was done as an outpatient procedure, which means that you went home the same day as your procedure, a responsible adult should be with you for the first 24 hours after you arrive home.  Keep all follow-up visits as directed by your health care provider. This is important. Contact a health care provider if:  You have a fever.  You have chills.  You have increased bleeding from the site. Hold pressure on the site. Get help right away if:  You have unusual pain at the site.  You have redness, warmth, or swelling at the site.  You have drainage (other than a small amount of blood on the dressing) from the site.  The site is bleeding, and the bleeding does not stop after 30 minutes of holding steady pressure on the site.  Your leg or foot becomes pale, cool, tingly, or numb. This information is not intended to replace advice given to you by  your health care provider. Make sure you discuss any questions you have with your health care provider. Document Released: 06/03/2014 Document Revised: 03/07/2016 Document Reviewed: 04/19/2014 Elsevier Interactive Patient Education  2018 [place] have an appointment set up with the [facility].  Multiple studies have shown that being followed by a dedicated atrial fibrillation clinic in addition to the standard care you receive from your other physicians improves health. We believe that enrollment in the atrial fibrillation  clinic will allow [place] to better care for you.   The phone number to the [facility] is [phone]. The clinic is staffed Monday through Friday from 8:30am to 5pm.  Parking Directions: The clinic is located in the Heart and Vascular Building connected to [facility]. 1)From [address] turn on to [address] and go to the 3rd entrance  (Heart and Vascular entrance) on the right. 2)Look to the right for Heart &Vascular Parking Garage. 3)A code for the entrance is required please call the clinic to receive this.   4)Take the elevators to the 1st floor. Registration is in the room with the glass walls at the end of the hallway.  If you have any trouble parking or locating the clinic, please dont hesitate to call [phone].

## 2018-09-24 NOTE — Anesthesia Procedure Notes (Signed)
Procedure Name: Intubation Date/Time: 09/24/2018 7:42 AM Performed by: Jenne Campus, CRNA Pre-anesthesia Checklist: Patient identified, Emergency Drugs available, Suction available and Patient being monitored Patient Re-evaluated:Patient Re-evaluated prior to induction Oxygen Delivery Method: Circle System Utilized Preoxygenation: Pre-oxygenation with 100% oxygen Induction Type: IV induction Ventilation: Mask ventilation without difficulty Laryngoscope Size: Miller and 3 Grade View: Grade I Tube type: Oral Tube size: 7.5 mm Number of attempts: 1 Airway Equipment and Method: Stylet and Oral airway Placement Confirmation: ETT inserted through vocal cords under direct vision,  positive ETCO2 and breath sounds checked- equal and bilateral Secured at: 23 cm Tube secured with: Tape Dental Injury: Teeth and Oropharynx as per pre-operative assessment

## 2018-09-24 NOTE — Anesthesia Preprocedure Evaluation (Addendum)
Anesthesia Evaluation  Patient identified by MRN, date of birth, ID band Patient awake    Reviewed: Allergy & Precautions, NPO status , Patient's Chart, lab work & pertinent test results, reviewed documented beta blocker date and time   Airway Mallampati: II  TM Distance: >3 FB Neck ROM: Full    Dental  (+) Dental Advisory Given, Poor Dentition, Missing   Pulmonary COPD, former smoker,    Pulmonary exam normal breath sounds clear to auscultation       Cardiovascular hypertension, Pt. on home beta blockers and Pt. on medications + CAD, + Past MI, + Cardiac Stents and + CABG  + dysrhythmias Atrial Fibrillation  Rhythm:Irregular Rate:Abnormal     Neuro/Psych negative neurological ROS     GI/Hepatic negative GI ROS, Neg liver ROS,   Endo/Other  diabetes, Type 2, Insulin Dependent  Renal/GU negative Renal ROS     Musculoskeletal  (+) Arthritis , Osteoarthritis,    Abdominal   Peds  Hematology  (+) Blood dyscrasia (Eliquis), ,   Anesthesia Other Findings Day of surgery medications reviewed with the patient.  Reproductive/Obstetrics                            Anesthesia Physical Anesthesia Plan  ASA: III  Anesthesia Plan: General   Post-op Pain Management:    Induction: Intravenous  PONV Risk Score and Plan: 2 and Midazolam, Dexamethasone and Ondansetron  Airway Management Planned: Oral ETT  Additional Equipment:   Intra-op Plan:   Post-operative Plan: Extubation in OR  Informed Consent: I have reviewed the patients History and Physical, chart, labs and discussed the procedure including the risks, benefits and alternatives for the proposed anesthesia with the patient or authorized representative who has indicated his/her understanding and acceptance.   Dental advisory given  Plan Discussed with: CRNA  Anesthesia Plan Comments:         Anesthesia Quick Evaluation

## 2018-09-24 NOTE — Progress Notes (Addendum)
Site area: Right groin a 9 and 9 french venous sheath was removed  Site Prior to Removal:  Level 0  Pressure Applied For 20 MINUTES    Bedrest1200 Beginning at 1230p  Manual:   Yes.    Patient Status During Pull:  stable  Post Pull Groin Site:  Level 0  Post Pull Instructions Given:  Yes.    Post Pull Pulses Present:  Yes.    Dressing Applied:  Yes.    Comments:

## 2018-09-24 NOTE — Transfer of Care (Signed)
Immediate Anesthesia Transfer of Care Note  Patient: Joseph [name]  Procedure(s) Performed: ATRIAL FIBRILLATION ABLATION (N/A )  Patient Location: Cath Lab  Anesthesia Type:General  Level of Consciousness: awake, oriented and patient cooperative  Airway & Oxygen Therapy: Patient Spontanous Breathing and Patient connected to face mask oxygen  Post-op Assessment: Report given to RN and Post -op Vital signs reviewed and stable  Post vital signs: Reviewed  Last Vitals:  Vitals Value Taken Time  BP 100/48 09/24/2018 11:06 AM  Temp    Pulse 59 09/24/2018 11:08 AM  Resp 16 09/24/2018 11:08 AM  SpO2 94 % 09/24/2018 11:08 AM  Vitals shown include unvalidated device data.  Last Pain:  Vitals:   09/24/18 1104  TempSrc:   PainSc: 0-No pain         Complications: No apparent anesthesia complications

## 2018-09-25 ENCOUNTER — Encounter (HOSPITAL_COMMUNITY): Payer: Self-pay | Admitting: Cardiology

## 2018-09-25 NOTE — Anesthesia Postprocedure Evaluation (Signed)
Anesthesia Post Note  Patient: Joseph [name]  Procedure(s) Performed: ATRIAL FIBRILLATION ABLATION (N/A )     Patient location during evaluation: Cath Lab Anesthesia Type: General Level of consciousness: awake and alert and awake Pain management: pain level controlled Vital Signs Assessment: post-procedure vital signs reviewed and stable Respiratory status: spontaneous breathing, nonlabored ventilation, respiratory function stable and patient connected to nasal cannula oxygen Cardiovascular status: blood pressure returned to baseline and stable Postop Assessment: no apparent nausea or vomiting Anesthetic complications: no    Last Vitals:  Vitals:   09/24/18 1600 09/24/18 1700  BP: (!) 101/54 (!) 95/57  Pulse: 75 75  Resp: 15 18  Temp:    SpO2: 98% 98%    Last Pain:  Vitals:   09/24/18 1700  TempSrc:   PainSc: 0-No pain                 Catalina Gravel

## 2018-09-29 ENCOUNTER — Telehealth: Payer: Self-pay | Admitting: Cardiology

## 2018-09-29 NOTE — Telephone Encounter (Signed)
Pt reports worsening bilateral LE edema.  Pt states that it was only his feet and ankles and now it has taken over the entire leg.  Reports possible 1-2+ pitting edema. Advised pt Dr Curt Bears recommends that he follow up in the AFib clinic this week to have concern evaluated. Pt is going to call his dtr to see what day she can take him.  He understands I will call him back tonight

## 2018-09-29 NOTE — Telephone Encounter (Signed)
Pt scheduled to see [name] tomorrow morning. Pt agreeable to plan.  Will forward this note to [name] for her [name]

## 2018-09-29 NOTE — Telephone Encounter (Signed)
  Pt c/o swelling: STAT is pt has developed SOB within 24 hours  1) How much weight have you gained and in what time span? Not sure  2) If swelling, where is the swelling located? Groin down to feet  3) Are you currently taking a fluid pill? no  4) Are you currently SOB? no  5) Do you have a log of your daily weights (if so, list)? no  6) Have you gained 3 pounds in a day or 5 pounds in a week? no  7) Have you traveled recently? No  Patient had ablation on 09/24/18 and since then he has had severe swelling. His legs are leaking fluid but he is still swelling to where he can't wear shoes or jeans. Joseph [name] would like to know if he can have something for the swelling.

## 2018-09-30 ENCOUNTER — Ambulatory Visit (HOSPITAL_COMMUNITY)
Admission: RE | Admit: 2018-09-30 | Discharge: 2018-09-30 | Disposition: A | Discharge status: Home / Self Care | Payer: PPO | Source: Ambulatory Visit | Attending: Nurse Practitioner | Admitting: Nurse Practitioner

## 2018-09-30 ENCOUNTER — Encounter (HOSPITAL_COMMUNITY): Payer: Self-pay | Admitting: Nurse Practitioner

## 2018-09-30 VITALS — BP 158/60 | HR 69 | Ht 70.0 in | Wt 214.4 lb

## 2018-09-30 DIAGNOSIS — I491 Atrial premature depolarization: Secondary | ICD-10-CM | POA: Diagnosis not present

## 2018-09-30 DIAGNOSIS — Z794 Long term (current) use of insulin: Secondary | ICD-10-CM | POA: Insufficient documentation

## 2018-09-30 DIAGNOSIS — E7800 Pure hypercholesterolemia, unspecified: Secondary | ICD-10-CM | POA: Diagnosis not present

## 2018-09-30 DIAGNOSIS — Z79899 Other long term (current) drug therapy: Secondary | ICD-10-CM | POA: Insufficient documentation

## 2018-09-30 DIAGNOSIS — I2510 Atherosclerotic heart disease of native coronary artery without angina pectoris: Secondary | ICD-10-CM | POA: Diagnosis not present

## 2018-09-30 DIAGNOSIS — Z7982 Long term (current) use of aspirin: Secondary | ICD-10-CM | POA: Insufficient documentation

## 2018-09-30 DIAGNOSIS — Z7901 Long term (current) use of anticoagulants: Secondary | ICD-10-CM | POA: Insufficient documentation

## 2018-09-30 DIAGNOSIS — E119 Type 2 diabetes mellitus without complications: Secondary | ICD-10-CM | POA: Diagnosis not present

## 2018-09-30 DIAGNOSIS — I252 Old myocardial infarction: Secondary | ICD-10-CM | POA: Diagnosis not present

## 2018-09-30 DIAGNOSIS — Z87891 Personal history of nicotine dependence: Secondary | ICD-10-CM | POA: Diagnosis not present

## 2018-09-30 DIAGNOSIS — I4891 Unspecified atrial fibrillation: Secondary | ICD-10-CM | POA: Insufficient documentation

## 2018-09-30 DIAGNOSIS — R600 Localized edema: Secondary | ICD-10-CM | POA: Diagnosis not present

## 2018-09-30 DIAGNOSIS — I10 Essential (primary) hypertension: Secondary | ICD-10-CM | POA: Diagnosis not present

## 2018-09-30 DIAGNOSIS — Z951 Presence of aortocoronary bypass graft: Secondary | ICD-10-CM | POA: Diagnosis not present

## 2018-09-30 LAB — COMPREHENSIVE METABOLIC PANEL
ALT: 39 U/L (ref 0–44)
AST: 42 U/L — AB (ref 15–41)
Albumin: 2.7 g/dL — ABNORMAL LOW (ref 3.5–5.0)
Alkaline Phosphatase: 112 U/L (ref 38–126)
Anion gap: 9 (ref 5–15)
BUN: 17 mg/dL (ref 8–23)
CHLORIDE: 106 mmol/L (ref 98–111)
CO2: 25 mmol/L (ref 22–32)
CREATININE: 1.13 mg/dL (ref 0.61–1.24)
Calcium: 8.4 mg/dL — ABNORMAL LOW (ref 8.9–10.3)
GFR calc Af Amer: >60 mL/min (ref >60)
GFR calc non Af Amer: >60 mL/min (ref >60)
Glucose, Bld: 191 mg/dL — ABNORMAL HIGH (ref 70–99)
Potassium: 4.5 mmol/L (ref 3.5–5.1)
Sodium: 140 mmol/L (ref 135–145)
Total Bilirubin: 1.5 mg/dL — ABNORMAL HIGH (ref 0.3–1.2)
Total Protein: 5.3 g/dL — ABNORMAL LOW (ref 6.5–8.1)

## 2018-09-30 LAB — CBC
HCT: 46.6 % (ref 39.0–52.0)
HEMOGLOBIN: 14.5 g/dL (ref 13.0–17.0)
MCH: 28.2 pg (ref 26.0–34.0)
MCHC: 31.1 g/dL (ref 30.0–36.0)
MCV: 90.7 fL (ref 80.0–100.0)
Platelets: 107 10*3/uL — ABNORMAL LOW (ref 150–400)
RBC: 5.14 MIL/uL (ref 4.22–5.81)
RDW: 15.3 % (ref 11.5–15.5)
WBC: 5.5 10*3/uL (ref 4.0–10.5)
nRBC: 0 % (ref 0.0–0.2)

## 2018-09-30 LAB — TROPONIN I: Troponin I: 0.2 ng/mL (ref <0.03)

## 2018-09-30 MED ORDER — FUROSEMIDE 40 MG PO TABS: 40.0000 mg | ORAL_TABLET | Freq: Every day | ORAL | 1 refills | Status: DC

## 2018-09-30 MED ORDER — POTASSIUM CHLORIDE CRYS ER 20 MEQ PO TBCR: 20.0000 meq | EXTENDED_RELEASE_TABLET | Freq: Every day | ORAL | 1 refills | Status: DC

## 2018-09-30 NOTE — Patient Instructions (Addendum)
Lasix 40mg  take 1 tablet daily  Start potassium 30meq once a day

## 2018-09-30 NOTE — Progress Notes (Signed)
Primary Care Physician: Joseph Sheriff, MD Referring Physician: Dr. Curt [name] Cardiologist: Dr. Earl Lites [name] is a 67 y.o. male with a h/o CAD, s/p 2 prior MI's/CABG 2018, DM, HTN, afib s/p ablation 09/24/18, that is in the afib clinic for worsening  LLE since ablation. He noted that the swelling was present prior to the ablation but not to this degree. He denies any chest pain/pressure /shortness of breath since the procedure and states that overall he feels improved other than the LLE to the point that the rt leg is weeping. He has had swelling to this point but not for several years. He was on lasix then but has been off for years. He denies any swallowing or groin issues. He is in SR today.    Today, he denies symptoms of palpitations, chest pain, shortness of breath, orthopnea, PND,  dizziness, presyncope, syncope, or neurologic sequela.+ for LLE. The patient is tolerating medications without difficulties and is otherwise without complaint today.   Past Medical History:  Diagnosis Date  . Atrial fibrillation ([place])   . CAD (coronary artery disease)   . Diabetes mellitus ([place])   . Essential hypertension   . Pure hypercholesterolemia    Past Surgical History:  Procedure Laterality Date  . ABLATION OF DYSRHYTHMIC FOCUS    . ATRIAL FIBRILLATION ABLATION N/A 09/24/2018   Procedure: ATRIAL FIBRILLATION ABLATION;  Surgeon: Constance Haw, MD;  Location: [facility];  Service: Cardiovascular;  Laterality: N/A;  . big toe removed    . cardiac catheterization    . CORONARY STENT PLACEMENT    . tonsillectomy      Current Outpatient Medications  Medication Sig Dispense Refill  . acetaminophen (TYLENOL) 500 MG tablet Take 500 mg by mouth every 4 (four) hours as needed.     Marland Kitchen apixaban (ELIQUIS) 5 MG TABS tablet Take 1 tablet (5 mg total) by mouth 2 (two) times daily. 180 tablet 3  . aspirin EC 81 MG tablet Take 81 mg by mouth daily.    Marland Kitchen atorvastatin (LIPITOR) 40  MG tablet Take 1 tablet (40 mg total) by mouth daily. 90 tablet 3  . gabapentin (NEURONTIN) 300 MG capsule Take 300 mg by mouth at bedtime.     . insulin regular (NOVOLIN R,HUMULIN R) 100 units/mL injection Inject 18-20 Units into the skin 3 (three) times daily before meals.     Marland Kitchen lisinopril (PRINIVIL,ZESTRIL) 20 MG tablet Take 20 mg by mouth daily.    . metoprolol tartrate (LOPRESSOR) 50 MG tablet Take 50 mg by mouth 2 (two) times daily.    . vitamin B-12 (CYANOCOBALAMIN) 1000 MCG tablet Take 1,000 mcg by mouth 2 (two) times daily.    . furosemide (LASIX) 40 MG tablet Take 1 tablet (40 mg total) by mouth daily. 30 tablet 1  . nitroGLYCERIN (NITROSTAT) 0.4 MG SL tablet Place 1 tablet (0.4 mg total) under the tongue every 5 (five) minutes as needed for chest pain. 25 tablet 11  . potassium chloride SA (K-DUR,KLOR-CON) 20 MEQ tablet Take 1 tablet (20 mEq total) by mouth daily. 30 tablet 1   No current facility-administered medications for this encounter.     No Known Allergies  Social History   Socioeconomic History  . Marital status: Unknown    Spouse name: Not on file  . Number of children: Not on file  . Years of education: Not on file  . Highest education level: Not on file  Occupational History  .  Not on file  Social Needs  . Financial resource strain: Not on file  . Food insecurity:    Worry: Not on file    Inability: Not on file  . Transportation needs:    Medical: Not on file    Non-medical: Not on file  Tobacco Use  . Smoking status: Former Research scientist (life sciences)  . Smokeless tobacco: Never Used  Substance and Sexual Activity  . Alcohol use: Yes  . Drug use: No  . Sexual activity: Not on file  Lifestyle  . Physical activity:    Days per week: Not on file    Minutes per session: Not on file  . Stress: Not on file  Relationships  . Social connections:    Talks on phone: Not on file    Gets together: Not on file    Attends religious service: Not on file    Active member of club  or organization: Not on file    Attends meetings of clubs or organizations: Not on file    Relationship status: Not on file  . Intimate partner violence:    Fear of current or ex partner: Not on file    Emotionally abused: Not on file    Physically abused: Not on file    Forced sexual activity: Not on file  Other Topics Concern  . Not on file  Social History Narrative  . Not on file    Family History  Problem Relation Age of Onset  . Heart disease Mother   . Cancer Father     ROS- All systems are reviewed and negative except as per the HPI above  Physical Exam: Vitals:   09/30/18 0912  BP: (!) 158/60  Pulse: 69  Weight: 97.3 kg  Height: 5\' 10"  (1.778 m)   Wt Readings from Last 3 Encounters:  09/30/18 97.3 kg  09/24/18 93.4 kg  08/31/18 93.4 kg    Labs: Lab Results  Component Value Date   NA 144 08/31/2018   K 4.7 08/31/2018   CL 106 08/31/2018   CO2 21 08/31/2018   GLUCOSE 93 08/31/2018   BUN 34 (H) 08/31/2018   CREATININE 1.69 (H) 08/31/2018   CALCIUM 8.7 08/31/2018   No results found for: INR No results found for: CHOL, HDL, LDLCALC, TRIG   GEN- The patient is well appearing, alert and oriented x 3 today.   Head- normocephalic, atraumatic Eyes-  Sclera clear, conjunctiva pink Ears- hearing intact Oropharynx- clear Neck- supple, no JVP Lymph- no cervical lymphadenopathy Lungs- Clear to ausculation bilaterally, normal work of breathing Heart- Regular rate and rhythm, no murmurs, rubs or gallops, PMI not laterally displaced GI- soft, NT, ND, + BS Extremities- no clubbing, cyanosis, or 2-3+ edema bilateral LLE's, rt LLE is weeping, diabetic skin  changes present   MS- no significant deformity or atrophy Skin- no rash or lesion Psych- euthymic mood, full affect Neuro- strength and sensation are intact  EKG-SR at 69 bpm, EKG automated read out states Critical Test STEMI-although  clinical presentation does not correlate with this in a asymptomatic  patient. However this EKG is changed from his EKG in April when seen by Dr. Ninfa [name], suggesting ST/T wave abnormality consider lateral ischemia, possible rt ventricular involvement      Assessment and Plan: 1. Afib S/p ablation 12/12 Is in SR Presents  with increasing LLE Will start lasix 40 mg today, increase to bid just for tomorrow, then 40 mg daily Start K+ 20 meq daily Cmet/cbc today Rt leg wrapped  with gauze, extra gauze sent home with pt Will have him f/u with Dr. Geraldo [name] Friday Continue apixaban 5 mg bid  2. Abnormal EKG Has changed since EKG of 01/2018 I called Dr. Lennox [name] and he reviewed He suggested probably not acute MI in an asymptomatic pt but he would draw a troponin and have pt stay here until result is back  Troponin reviewed with Dr. Curt [name], it is positive at .20, but  Dr. Curt [name] says this can be normal with the burning after ablation. He came in and  talked to pt and with a asymptomatic pt, he can go home, will plan on diuresis and f/u early next week with Dr. Geraldo [name], appointment made for Monday.  CBC stable and creatinine at 1.13 and BUN at [place]. Joseph [name], [facility] [address] [place], [place] [address] [phone]

## 2018-10-05 ENCOUNTER — Encounter: Payer: Self-pay | Admitting: Cardiology

## 2018-10-05 ENCOUNTER — Ambulatory Visit: Payer: PPO | Admitting: Cardiology

## 2018-10-05 NOTE — Telephone Encounter (Signed)
This encounter was created in error - please disregard.

## 2018-10-05 NOTE — Telephone Encounter (Signed)
New message ° ° °Patient is returning your call. °

## 2018-10-08 ENCOUNTER — Other Ambulatory Visit: Payer: Self-pay | Admitting: Cardiology

## 2018-10-08 ENCOUNTER — Encounter: Payer: Self-pay | Admitting: Cardiology

## 2018-10-08 ENCOUNTER — Ambulatory Visit (INDEPENDENT_AMBULATORY_CARE_PROVIDER_SITE_OTHER): Payer: PPO | Admitting: Cardiology

## 2018-10-08 VITALS — BP 126/62 | HR 69 | Ht 70.0 in | Wt 189.0 lb

## 2018-10-08 DIAGNOSIS — E782 Mixed hyperlipidemia: Secondary | ICD-10-CM

## 2018-10-08 DIAGNOSIS — E1165 Type 2 diabetes mellitus with hyperglycemia: Secondary | ICD-10-CM

## 2018-10-08 DIAGNOSIS — Z8679 Personal history of other diseases of the circulatory system: Secondary | ICD-10-CM

## 2018-10-08 DIAGNOSIS — I340 Nonrheumatic mitral (valve) insufficiency: Secondary | ICD-10-CM | POA: Diagnosis not present

## 2018-10-08 DIAGNOSIS — I10 Essential (primary) hypertension: Secondary | ICD-10-CM | POA: Diagnosis not present

## 2018-10-08 DIAGNOSIS — E1140 Type 2 diabetes mellitus with diabetic neuropathy, unspecified: Secondary | ICD-10-CM | POA: Diagnosis not present

## 2018-10-08 DIAGNOSIS — I4819 Other persistent atrial fibrillation: Secondary | ICD-10-CM

## 2018-10-08 DIAGNOSIS — I2510 Atherosclerotic heart disease of native coronary artery without angina pectoris: Secondary | ICD-10-CM | POA: Diagnosis not present

## 2018-10-08 DIAGNOSIS — Z9889 Other specified postprocedural states: Secondary | ICD-10-CM

## 2018-10-08 DIAGNOSIS — IMO0002 Reserved for concepts with insufficient information to code with codable children: Secondary | ICD-10-CM

## 2018-10-08 HISTORY — DX: Personal history of other diseases of the circulatory system: Z86.79

## 2018-10-08 HISTORY — DX: Personal history of other diseases of the circulatory system: Z98.890

## 2018-10-08 LAB — BASIC METABOLIC PANEL
BUN/Creatinine Ratio: 18 (ref 10–24)
BUN: 21 mg/dL (ref 8–27)
CO2: 24 mmol/L (ref 20–29)
CREATININE: 1.17 mg/dL (ref 0.76–1.27)
Calcium: 9.1 mg/dL (ref 8.6–10.2)
Chloride: 98 mmol/L (ref 96–106)
GFR calc Af Amer: 74 mL/min/{1.73_m2} (ref >59)
GFR calc non Af Amer: 64 mL/min/{1.73_m2} (ref >59)
Glucose: 223 mg/dL — ABNORMAL HIGH (ref 65–99)
Potassium: 5.3 mmol/L — ABNORMAL HIGH (ref 3.5–5.2)
Sodium: 138 mmol/L (ref 134–144)

## 2018-10-08 NOTE — Patient Instructions (Addendum)
Medication Instructions:  Your physician recommends that you continue on your current medications as directed. Please refer to the Current Medication list given to you today.  If you need a refill on your cardiac medications before your next appointment, please call your pharmacy.   Lab work: Your physician recommends that you return for lab work in:BMP   If you have labs (blood work) drawn today and your tests are completely normal, you will receive your results only by: Marland Kitchen MyChart Message (if you have MyChart) OR . A paper copy in the mail If you have any lab test that is abnormal or we need to change your treatment, we will call you to review the results.  Testing/Procedures: None  Follow-Up: At [facility], you and your health needs are our priority.  As part of our continuing mission to provide you with exceptional heart care, we have created designated Provider Care Teams.  These Care Teams include your primary Cardiologist (physician) and Advanced Practice Providers (APPs -  Physician Assistants and Nurse Practitioners) who all work together to provide you with the care you need, when you need it. You will need a follow up appointment in 6 months.  Please call our office 2 months in advance to schedule this appointment.  You may see No primary care provider on file. or another member of our [employer] Provider Team in [place]: Jenne Campus, MD . Shirlee More, MD  Any Other Special Instructions Will Be Listed Below (If Applicable).

## 2018-10-08 NOTE — Progress Notes (Signed)
Cardiology Office Note:    Date:  10/08/2018   ID:  Joseph [name], DOB [birth year], MRN [id number]  PCP:  Angelina Sheriff, MD  Cardiologist:  Jenean Lindau, MD   Referring MD: Angelina Sheriff, MD    ASSESSMENT:    1. Coronary artery disease involving native coronary artery of native heart without angina pectoris   2. Benign essential hypertension   3. Mixed hyperlipidemia   4. Moderate mitral regurgitation   5. Type 2 diabetes, uncontrolled, with neuropathy ([place])   6. S/P ablation of atrial fibrillation    PLAN:    In order of problems listed above:  1. Patient is post ablation for atrial fibrillation and doing well and clinically in sinus rhythm and taking his medications appropriately.  He is on anticoagulation. 2. For his pedal edema he was initiated on diuretic by our colleagues in electrophysiology he seems to be tolerating it well and his pedal edema is better and we will obtain Chem-7 today. 3. He was advised to see his primary care physician for his issues with wounds in the lower extremities and possibly get a consultation with the wound care center especially because of his diabetic status. 4. I told him about keeping his feet elevated so his swelling can be better and his circulation also be improved which will eventually help in healing. Patient will be seen in follow-up appointment in 6 months or earlier if the patient has any concerns    Medication Adjustments/Labs and Tests Ordered: Current medicines are reviewed at length with the patient today.  Concerns regarding medicines are outlined above.  No orders of the defined types were placed in this encounter.  No orders of the defined types were placed in this encounter.    No chief complaint on file.    History of Present Illness:    Joseph [name] is a 67 y.o. male.  Patient has history of atrial fibrillation and has undergone ablation.  He denies any problems at this time and takes care of  activities of daily living.  No chest pain orthopnea or PND.  He is only issue is that he has bilateral leg swelling which is been going on for quite some time.  He is a diabetic and has wounds which are healing.  Past Medical History:  Diagnosis Date  . Atrial fibrillation ([place])   . CAD (coronary artery disease)   . Diabetes mellitus ([place])   . Essential hypertension   . Pure hypercholesterolemia     Past Surgical History:  Procedure Laterality Date  . ABLATION OF DYSRHYTHMIC FOCUS    . ATRIAL FIBRILLATION ABLATION N/A 09/24/2018   Procedure: ATRIAL FIBRILLATION ABLATION;  Surgeon: Constance Haw, MD;  Location: [facility];  Service: Cardiovascular;  Laterality: N/A;  . big toe removed    . cardiac catheterization    . CORONARY STENT PLACEMENT    . tonsillectomy      Current Medications: Current Meds  Medication Sig  . acetaminophen (TYLENOL) 500 MG tablet Take 500 mg by mouth every 4 (four) hours as needed.   Marland Kitchen apixaban (ELIQUIS) 5 MG TABS tablet Take 1 tablet (5 mg total) by mouth 2 (two) times daily.  Marland Kitchen aspirin EC 81 MG tablet Take 81 mg by mouth daily.  Marland Kitchen atorvastatin (LIPITOR) 40 MG tablet Take 1 tablet (40 mg total) by mouth daily.  . furosemide (LASIX) 40 MG tablet Take 1 tablet (40 mg total) by mouth daily.  Marland Kitchen  gabapentin (NEURONTIN) 300 MG capsule Take 300 mg by mouth at bedtime.   . insulin regular (NOVOLIN R,HUMULIN R) 100 units/mL injection Inject 18-20 Units into the skin 3 (three) times daily before meals.   Marland Kitchen lisinopril (PRINIVIL,ZESTRIL) 20 MG tablet Take 20 mg by mouth daily.  . metoprolol tartrate (LOPRESSOR) 50 MG tablet Take 50 mg by mouth 2 (two) times daily.  . potassium chloride SA (K-DUR,KLOR-CON) 20 MEQ tablet Take 1 tablet (20 mEq total) by mouth daily.  . vitamin B-12 (CYANOCOBALAMIN) 1000 MCG tablet Take 1,000 mcg by mouth 2 (two) times daily.     Allergies:   Patient has no known allergies.   Social History   Socioeconomic History    . Marital status: Unknown    Spouse name: Not on file  . Number of children: Not on file  . Years of education: Not on file  . Highest education level: Not on file  Occupational History  . Not on file  Social Needs  . Financial resource strain: Not on file  . Food insecurity:    Worry: Not on file    Inability: Not on file  . Transportation needs:    Medical: Not on file    Non-medical: Not on file  Tobacco Use  . Smoking status: Former Research scientist (life sciences)  . Smokeless tobacco: Never Used  Substance and Sexual Activity  . Alcohol use: Yes  . Drug use: No  . Sexual activity: Not on file  Lifestyle  . Physical activity:    Days per week: Not on file    Minutes per session: Not on file  . Stress: Not on file  Relationships  . Social connections:    Talks on phone: Not on file    Gets together: Not on file    Attends religious service: Not on file    Active member of club or organization: Not on file    Attends meetings of clubs or organizations: Not on file    Relationship status: Not on file  Other Topics Concern  . Not on file  Social History Narrative  . Not on file     Family History: The patient's family history includes Cancer in his father; Heart disease in his mother.  ROS:   Please see the history of present illness.    All other systems reviewed and are negative.  EKGs/Labs/Other Studies Reviewed:    The following studies were reviewed today: I discussed my findings with the patient at extensive length.   Recent Labs: 09/30/2018: ALT 39; BUN 17; Creatinine, Ser 1.13; Hemoglobin 14.5; Platelets 107; Potassium 4.5; Sodium 140  Recent Lipid Panel No results found for: CHOL, TRIG, HDL, CHOLHDL, VLDL, LDLCALC, LDLDIRECT  Physical Exam:    VS:  BP 126/62 (BP Location: Right Arm, Patient Position: Sitting, Cuff Size: Normal)   Pulse 69   Ht 5\' 10"  (1.778 m)   Wt 189 lb (85.7 kg)   SpO2 96%   BMI 27.12 kg/m     Wt Readings from Last 3 Encounters:  10/08/18  189 lb (85.7 kg)  09/30/18 214 lb 6.4 oz (97.3 kg)  09/24/18 206 lb (93.4 kg)     GEN: Patient is in no acute distress HEENT: Normal NECK: No JVD; No carotid bruits LYMPHATICS: No lymphadenopathy CARDIAC: Hear sounds regular, 2/6 systolic murmur at the apex. RESPIRATORY:  Clear to auscultation without rales, wheezing or rhonchi  ABDOMEN: Soft, non-tender, non-distended MUSCULOSKELETAL:   No deformity.  Bilateral pedal edema and the patient  has wounds on right lower extremity which are not open but healing but extensive in nature. SKIN: Warm and dry NEUROLOGIC:  Alert and oriented x 3 PSYCHIATRIC:  Normal affect   Signed, Jenean Lindau, MD  10/08/2018 9:55 AM    [facility]

## 2018-10-09 ENCOUNTER — Telehealth: Payer: Self-pay | Admitting: Emergency Medicine

## 2018-10-09 DIAGNOSIS — I10 Essential (primary) hypertension: Secondary | ICD-10-CM

## 2018-10-09 NOTE — Telephone Encounter (Signed)
Patient informed of lab results and to stop potassium and have labs rechecked in one week per Dr. Geraldo Pitter. Patient verbally understands.

## 2018-10-13 DIAGNOSIS — E7800 Pure hypercholesterolemia, unspecified: Secondary | ICD-10-CM | POA: Diagnosis not present

## 2018-10-13 DIAGNOSIS — I259 Chronic ischemic heart disease, unspecified: Secondary | ICD-10-CM | POA: Diagnosis not present

## 2018-10-13 DIAGNOSIS — I10 Essential (primary) hypertension: Secondary | ICD-10-CM | POA: Diagnosis not present

## 2018-10-13 DIAGNOSIS — S81802A Unspecified open wound, left lower leg, initial encounter: Secondary | ICD-10-CM | POA: Diagnosis not present

## 2018-10-13 DIAGNOSIS — Z6824 Body mass index (BMI) 24.0-24.9, adult: Secondary | ICD-10-CM | POA: Diagnosis not present

## 2018-10-16 DIAGNOSIS — E11622 Type 2 diabetes mellitus with other skin ulcer: Secondary | ICD-10-CM | POA: Diagnosis not present

## 2018-10-16 DIAGNOSIS — L97909 Non-pressure chronic ulcer of unspecified part of unspecified lower leg with unspecified severity: Secondary | ICD-10-CM | POA: Diagnosis not present

## 2018-10-21 DIAGNOSIS — Z794 Long term (current) use of insulin: Secondary | ICD-10-CM | POA: Diagnosis not present

## 2018-10-21 DIAGNOSIS — Z87891 Personal history of nicotine dependence: Secondary | ICD-10-CM | POA: Diagnosis not present

## 2018-10-21 DIAGNOSIS — I70238 Atherosclerosis of native arteries of right leg with ulceration of other part of lower right leg: Secondary | ICD-10-CM | POA: Diagnosis not present

## 2018-10-21 DIAGNOSIS — E1140 Type 2 diabetes mellitus with diabetic neuropathy, unspecified: Secondary | ICD-10-CM | POA: Diagnosis not present

## 2018-10-21 DIAGNOSIS — E11622 Type 2 diabetes mellitus with other skin ulcer: Secondary | ICD-10-CM | POA: Diagnosis not present

## 2018-10-21 DIAGNOSIS — I252 Old myocardial infarction: Secondary | ICD-10-CM | POA: Diagnosis not present

## 2018-10-21 DIAGNOSIS — I10 Essential (primary) hypertension: Secondary | ICD-10-CM | POA: Diagnosis not present

## 2018-10-21 DIAGNOSIS — J449 Chronic obstructive pulmonary disease, unspecified: Secondary | ICD-10-CM | POA: Diagnosis not present

## 2018-10-21 DIAGNOSIS — I2510 Atherosclerotic heart disease of native coronary artery without angina pectoris: Secondary | ICD-10-CM | POA: Diagnosis not present

## 2018-10-21 DIAGNOSIS — E1151 Type 2 diabetes mellitus with diabetic peripheral angiopathy without gangrene: Secondary | ICD-10-CM | POA: Diagnosis not present

## 2018-10-21 DIAGNOSIS — L97812 Non-pressure chronic ulcer of other part of right lower leg with fat layer exposed: Secondary | ICD-10-CM | POA: Diagnosis not present

## 2018-10-22 ENCOUNTER — Encounter (HOSPITAL_COMMUNITY): Payer: Self-pay | Admitting: Nurse Practitioner

## 2018-10-22 ENCOUNTER — Ambulatory Visit (HOSPITAL_COMMUNITY)
Admission: RE | Admit: 2018-10-22 | Discharge: 2018-10-22 | Disposition: A | Discharge status: Home / Self Care | Payer: PPO | Source: Ambulatory Visit | Attending: Nurse Practitioner | Admitting: Nurse Practitioner

## 2018-10-22 VITALS — BP 134/78 | HR 69 | Ht 70.0 in | Wt 186.0 lb

## 2018-10-22 DIAGNOSIS — E7800 Pure hypercholesterolemia, unspecified: Secondary | ICD-10-CM | POA: Insufficient documentation

## 2018-10-22 DIAGNOSIS — Z79899 Other long term (current) drug therapy: Secondary | ICD-10-CM | POA: Insufficient documentation

## 2018-10-22 DIAGNOSIS — I2510 Atherosclerotic heart disease of native coronary artery without angina pectoris: Secondary | ICD-10-CM | POA: Insufficient documentation

## 2018-10-22 DIAGNOSIS — Z87891 Personal history of nicotine dependence: Secondary | ICD-10-CM | POA: Diagnosis not present

## 2018-10-22 DIAGNOSIS — Z9889 Other specified postprocedural states: Secondary | ICD-10-CM

## 2018-10-22 DIAGNOSIS — E119 Type 2 diabetes mellitus without complications: Secondary | ICD-10-CM | POA: Diagnosis not present

## 2018-10-22 DIAGNOSIS — Z7901 Long term (current) use of anticoagulants: Secondary | ICD-10-CM | POA: Insufficient documentation

## 2018-10-22 DIAGNOSIS — I10 Essential (primary) hypertension: Secondary | ICD-10-CM | POA: Diagnosis not present

## 2018-10-22 DIAGNOSIS — I4891 Unspecified atrial fibrillation: Secondary | ICD-10-CM | POA: Insufficient documentation

## 2018-10-22 DIAGNOSIS — Z794 Long term (current) use of insulin: Secondary | ICD-10-CM | POA: Insufficient documentation

## 2018-10-22 DIAGNOSIS — Z951 Presence of aortocoronary bypass graft: Secondary | ICD-10-CM | POA: Insufficient documentation

## 2018-10-22 DIAGNOSIS — Z8679 Personal history of other diseases of the circulatory system: Secondary | ICD-10-CM

## 2018-10-22 DIAGNOSIS — Z8249 Family history of ischemic heart disease and other diseases of the circulatory system: Secondary | ICD-10-CM | POA: Diagnosis not present

## 2018-10-22 DIAGNOSIS — Z7982 Long term (current) use of aspirin: Secondary | ICD-10-CM | POA: Insufficient documentation

## 2018-10-22 DIAGNOSIS — I252 Old myocardial infarction: Secondary | ICD-10-CM | POA: Diagnosis not present

## 2018-10-22 NOTE — Progress Notes (Signed)
Primary Care Physician: Angelina Sheriff, MD Referring Physician: Dr. Curt Bears Cardiologist: Dr. Earl Lites Joseph [name] is a 68 y.o. male with a h/o CAD, s/p 2 prior MI's/CABG 2018, DM, HTN, afib s/p ablation 09/24/18, that is in the afib clinic for worsening  LLE since ablation. He noted that the swelling was present prior to the ablation but not to this degree. He denies any chest pain/pressure /shortness of breath since the procedure and states that overall he feels improved other than the LLE to the point that the rt leg is weeping. He has had swelling to this point but not for several years. He was on lasix then but has been off for years. He denies any swallowing or groin issues. He is in SR today.   F/u in afib clinic,12/9, one month following cardioversion.He has lost from 97 kg to 84 kg since I saw him last. His leg edema  is greatly improved but still has an area that is healing under the care of the wound clinic, rt lower leg.He feels improved.He denies any swallowing or groin issues.He is staying in [place].   Today, he denies symptoms of palpitations, chest pain, shortness of breath, orthopnea, PND,  dizziness, presyncope, syncope, or neurologic sequela.+ for LLE. The patient is tolerating medications without difficulties and is otherwise without complaint today.   Past Medical History:  Diagnosis Date  . Atrial fibrillation ([place])   . CAD (coronary artery disease)   . Diabetes mellitus ([place])   . Essential hypertension   . Pure hypercholesterolemia    Past Surgical History:  Procedure Laterality Date  . ABLATION OF DYSRHYTHMIC FOCUS    . ATRIAL FIBRILLATION ABLATION N/A 09/24/2018   Procedure: ATRIAL FIBRILLATION ABLATION;  Surgeon: Constance Haw, MD;  Location: [facility];  Service: Cardiovascular;  Laterality: N/A;  . big toe removed    . cardiac catheterization    . CORONARY STENT PLACEMENT    . tonsillectomy      Current Outpatient Medications    Medication Sig Dispense Refill  . acetaminophen (TYLENOL) 500 MG tablet Take 500 mg by mouth every 4 (four) hours as needed.     Marland Kitchen apixaban (ELIQUIS) 5 MG TABS tablet Take 1 tablet (5 mg total) by mouth 2 (two) times daily. 180 tablet 3  . aspirin EC 81 MG tablet Take 81 mg by mouth daily.    Marland Kitchen atorvastatin (LIPITOR) 40 MG tablet Take 1 tablet (40 mg total) by mouth daily. 90 tablet 3  . furosemide (LASIX) 40 MG tablet Take 1 tablet (40 mg total) by mouth daily. 30 tablet 1  . gabapentin (NEURONTIN) 300 MG capsule Take 300 mg by mouth at bedtime.     . insulin regular (NOVOLIN R,HUMULIN R) 100 units/mL injection Inject 18-20 Units into the skin 3 (three) times daily before meals.     Marland Kitchen lisinopril (PRINIVIL,ZESTRIL) 10 MG tablet Take 10 mg by mouth daily.    . metoprolol tartrate (LOPRESSOR) 50 MG tablet Take 50 mg by mouth 2 (two) times daily.    . vitamin B-12 (CYANOCOBALAMIN) 1000 MCG tablet Take 1,000 mcg by mouth 2 (two) times daily.    . nitroGLYCERIN (NITROSTAT) 0.4 MG SL tablet Place 1 tablet (0.4 mg total) under the tongue every 5 (five) minutes as needed for chest pain. (Patient not taking: Reported on 10/22/2018) 25 tablet 11   No current facility-administered medications for this encounter.     No Known Allergies  Social History  Socioeconomic History  . Marital status: Unknown    Spouse name: Not on file  . Number of children: Not on file  . Years of education: Not on file  . Highest education level: Not on file  Occupational History  . Not on file  Social Needs  . Financial resource strain: Not on file  . Food insecurity:    Worry: Not on file    Inability: Not on file  . Transportation needs:    Medical: Not on file    Non-medical: Not on file  Tobacco Use  . Smoking status: Former Research scientist (life sciences)  . Smokeless tobacco: Never Used  Substance and Sexual Activity  . Alcohol use: Yes  . Drug use: No  . Sexual activity: Not on file  Lifestyle  . Physical activity:     Days per week: Not on file    Minutes per session: Not on file  . Stress: Not on file  Relationships  . Social connections:    Talks on phone: Not on file    Gets together: Not on file    Attends religious service: Not on file    Active member of club or organization: Not on file    Attends meetings of clubs or organizations: Not on file    Relationship status: Not on file  . Intimate partner violence:    Fear of current or ex partner: Not on file    Emotionally abused: Not on file    Physically abused: Not on file    Forced sexual activity: Not on file  Other Topics Concern  . Not on file  Social History Narrative  . Not on file    Family History  Problem Relation Age of Onset  . Heart disease Mother   . Cancer Father     ROS- All systems are reviewed and negative except as per the HPI above  Physical Exam: Vitals:   10/22/18 0951  BP: 134/78  Pulse: 69  Weight: 84.4 kg  Height: 5\' 10"  (1.778 m)   Wt Readings from Last 3 Encounters:  10/22/18 84.4 kg  10/08/18 85.7 kg  09/30/18 97.3 kg    Labs: Lab Results  Component Value Date   NA 138 10/08/2018   K 5.3 (H) 10/08/2018   CL 98 10/08/2018   CO2 24 10/08/2018   GLUCOSE 223 (H) 10/08/2018   BUN 21 10/08/2018   CREATININE 1.17 10/08/2018   CALCIUM 9.1 10/08/2018   No results found for: INR No results found for: CHOL, HDL, LDLCALC, TRIG   GEN- The patient is well appearing, alert and oriented x 3 today.   Head- normocephalic, atraumatic Eyes-  Sclera clear, conjunctiva pink Ears- hearing intact Oropharynx- clear Neck- supple, no JVP Lymph- no cervical lymphadenopathy Lungs- Clear to ausculation bilaterally, normal work of breathing Heart- Regular rate and rhythm, no murmurs, rubs or gallops, PMI not laterally displaced GI- soft, NT, ND, + BS Extremities- no clubbing, cyanosis, or 2-3+ edema bilateral LLE's, rt LLE is weeping, diabetic skin  changes present   MS- no significant deformity or  atrophy Skin- no rash or lesion Psych- euthymic mood, full affect Neuro- strength and sensation are intact  EKG- NSR at 69 bpm, PR int 128 ms, qrs 98 ms, qtc 462 ms    Assessment and Plan: 1. Afib S/p ablation 12/12 Is in SR LLE much improved on daily lasix Continue apixaban 5 mg bid, is aware not to interrupt anticoagultion  F/u with Dr. Curt Bears 3/20  [name]  Eulah Pont, [facility] [address] [place], [place] [address] [phone]

## 2018-10-27 DIAGNOSIS — R410 Disorientation, unspecified: Secondary | ICD-10-CM | POA: Diagnosis not present

## 2018-10-27 DIAGNOSIS — R4182 Altered mental status, unspecified: Secondary | ICD-10-CM | POA: Diagnosis not present

## 2018-10-27 DIAGNOSIS — E11649 Type 2 diabetes mellitus with hypoglycemia without coma: Secondary | ICD-10-CM | POA: Diagnosis not present

## 2018-10-27 DIAGNOSIS — R531 Weakness: Secondary | ICD-10-CM | POA: Diagnosis not present

## 2018-10-27 DIAGNOSIS — I739 Peripheral vascular disease, unspecified: Secondary | ICD-10-CM | POA: Diagnosis not present

## 2018-10-27 DIAGNOSIS — I10 Essential (primary) hypertension: Secondary | ICD-10-CM | POA: Diagnosis not present

## 2018-10-27 DIAGNOSIS — I252 Old myocardial infarction: Secondary | ICD-10-CM | POA: Diagnosis not present

## 2018-10-28 DIAGNOSIS — E1151 Type 2 diabetes mellitus with diabetic peripheral angiopathy without gangrene: Secondary | ICD-10-CM | POA: Diagnosis not present

## 2018-10-28 DIAGNOSIS — I2510 Atherosclerotic heart disease of native coronary artery without angina pectoris: Secondary | ICD-10-CM | POA: Diagnosis not present

## 2018-10-28 DIAGNOSIS — I70238 Atherosclerosis of native arteries of right leg with ulceration of other part of lower right leg: Secondary | ICD-10-CM | POA: Diagnosis not present

## 2018-10-28 DIAGNOSIS — L97812 Non-pressure chronic ulcer of other part of right lower leg with fat layer exposed: Secondary | ICD-10-CM | POA: Diagnosis not present

## 2018-10-29 ENCOUNTER — Telehealth: Payer: Self-pay | Admitting: *Deleted

## 2018-10-29 DIAGNOSIS — J849 Interstitial pulmonary disease, unspecified: Secondary | ICD-10-CM

## 2018-10-29 DIAGNOSIS — I739 Peripheral vascular disease, unspecified: Secondary | ICD-10-CM | POA: Diagnosis not present

## 2018-10-29 DIAGNOSIS — M7989 Other specified soft tissue disorders: Secondary | ICD-10-CM | POA: Diagnosis not present

## 2018-10-29 DIAGNOSIS — I25119 Atherosclerotic heart disease of native coronary artery with unspecified angina pectoris: Secondary | ICD-10-CM | POA: Diagnosis not present

## 2018-10-29 NOTE — Telephone Encounter (Signed)
Notes recorded by Stanton Kidney, RN on 10/05/2018 at 11:36 AM EST Informed patient of results and verbal understanding expressed. Pt agreeable to plan and understands office will call to arrange f/u CT next year.

## 2018-10-29 NOTE — Telephone Encounter (Signed)
-----   Message from Stanton Kidney, RN sent at 10/05/2018 11:09 AM EST -----  ----- Message ----- From: Constance Haw, MD Sent: 09/21/2018   9:33 AM EST To: Stanton Kidney, RN   CT with out LAA thrombus reviewed. Has consolidation noted, less likely neoplasm. Will need repeat CT in 2 months.

## 2018-11-04 ENCOUNTER — Other Ambulatory Visit: Payer: Self-pay | Admitting: *Deleted

## 2018-11-04 DIAGNOSIS — I96 Gangrene, not elsewhere classified: Secondary | ICD-10-CM | POA: Diagnosis not present

## 2018-11-04 DIAGNOSIS — E1152 Type 2 diabetes mellitus with diabetic peripheral angiopathy with gangrene: Secondary | ICD-10-CM | POA: Diagnosis not present

## 2018-11-04 DIAGNOSIS — I2510 Atherosclerotic heart disease of native coronary artery without angina pectoris: Secondary | ICD-10-CM | POA: Diagnosis not present

## 2018-11-04 DIAGNOSIS — B999 Unspecified infectious disease: Secondary | ICD-10-CM

## 2018-11-04 DIAGNOSIS — L97812 Non-pressure chronic ulcer of other part of right lower leg with fat layer exposed: Secondary | ICD-10-CM | POA: Diagnosis not present

## 2018-11-04 DIAGNOSIS — I70238 Atherosclerosis of native arteries of right leg with ulceration of other part of lower right leg: Secondary | ICD-10-CM | POA: Diagnosis not present

## 2018-11-04 DIAGNOSIS — E1151 Type 2 diabetes mellitus with diabetic peripheral angiopathy without gangrene: Secondary | ICD-10-CM | POA: Diagnosis not present

## 2018-11-04 DIAGNOSIS — J181 Lobar pneumonia, unspecified organism: Secondary | ICD-10-CM

## 2018-11-11 DIAGNOSIS — L97812 Non-pressure chronic ulcer of other part of right lower leg with fat layer exposed: Secondary | ICD-10-CM | POA: Diagnosis not present

## 2018-11-11 DIAGNOSIS — I2510 Atherosclerotic heart disease of native coronary artery without angina pectoris: Secondary | ICD-10-CM | POA: Diagnosis not present

## 2018-11-11 DIAGNOSIS — Z09 Encounter for follow-up examination after completed treatment for conditions other than malignant neoplasm: Secondary | ICD-10-CM | POA: Diagnosis not present

## 2018-11-11 DIAGNOSIS — Z872 Personal history of diseases of the skin and subcutaneous tissue: Secondary | ICD-10-CM | POA: Diagnosis not present

## 2018-11-11 DIAGNOSIS — I739 Peripheral vascular disease, unspecified: Secondary | ICD-10-CM | POA: Diagnosis not present

## 2018-11-11 DIAGNOSIS — Z8639 Personal history of other endocrine, nutritional and metabolic disease: Secondary | ICD-10-CM | POA: Diagnosis not present

## 2018-11-11 DIAGNOSIS — E119 Type 2 diabetes mellitus without complications: Secondary | ICD-10-CM | POA: Diagnosis not present

## 2018-11-18 DIAGNOSIS — E11610 Type 2 diabetes mellitus with diabetic neuropathic arthropathy: Secondary | ICD-10-CM | POA: Diagnosis not present

## 2018-11-18 DIAGNOSIS — M2042 Other hammer toe(s) (acquired), left foot: Secondary | ICD-10-CM | POA: Diagnosis not present

## 2018-11-18 DIAGNOSIS — E1140 Type 2 diabetes mellitus with diabetic neuropathy, unspecified: Secondary | ICD-10-CM | POA: Diagnosis not present

## 2018-11-18 DIAGNOSIS — Z89412 Acquired absence of left great toe: Secondary | ICD-10-CM | POA: Diagnosis not present

## 2018-11-18 DIAGNOSIS — L84 Corns and callosities: Secondary | ICD-10-CM | POA: Diagnosis not present

## 2018-11-18 DIAGNOSIS — M2041 Other hammer toe(s) (acquired), right foot: Secondary | ICD-10-CM

## 2018-11-18 DIAGNOSIS — L603 Nail dystrophy: Secondary | ICD-10-CM

## 2018-11-18 DIAGNOSIS — E1165 Type 2 diabetes mellitus with hyperglycemia: Secondary | ICD-10-CM | POA: Diagnosis not present

## 2018-11-18 HISTORY — DX: Other hammer toe(s) (acquired), right foot: M20.41

## 2018-11-18 HISTORY — DX: Nail dystrophy: L60.3

## 2018-11-30 ENCOUNTER — Encounter (INDEPENDENT_AMBULATORY_CARE_PROVIDER_SITE_OTHER): Payer: Self-pay

## 2018-11-30 ENCOUNTER — Ambulatory Visit (INDEPENDENT_AMBULATORY_CARE_PROVIDER_SITE_OTHER)
Admission: RE | Admit: 2018-11-30 | Discharge: 2018-11-30 | Disposition: A | Discharge status: Home / Self Care | Payer: PPO | Source: Ambulatory Visit | Attending: Cardiology | Admitting: Cardiology

## 2018-11-30 DIAGNOSIS — J849 Interstitial pulmonary disease, unspecified: Secondary | ICD-10-CM | POA: Diagnosis not present

## 2018-11-30 DIAGNOSIS — J439 Emphysema, unspecified: Secondary | ICD-10-CM | POA: Diagnosis not present

## 2018-12-02 DIAGNOSIS — I10 Essential (primary) hypertension: Secondary | ICD-10-CM | POA: Diagnosis not present

## 2018-12-02 DIAGNOSIS — E11610 Type 2 diabetes mellitus with diabetic neuropathic arthropathy: Secondary | ICD-10-CM | POA: Diagnosis not present

## 2018-12-02 DIAGNOSIS — E1165 Type 2 diabetes mellitus with hyperglycemia: Secondary | ICD-10-CM | POA: Diagnosis not present

## 2018-12-02 DIAGNOSIS — E1140 Type 2 diabetes mellitus with diabetic neuropathy, unspecified: Secondary | ICD-10-CM | POA: Diagnosis not present

## 2018-12-02 DIAGNOSIS — E041 Nontoxic single thyroid nodule: Secondary | ICD-10-CM | POA: Diagnosis not present

## 2018-12-02 DIAGNOSIS — Z794 Long term (current) use of insulin: Secondary | ICD-10-CM | POA: Diagnosis not present

## 2018-12-14 ENCOUNTER — Telehealth: Payer: Self-pay | Admitting: *Deleted

## 2018-12-14 NOTE — Telephone Encounter (Signed)
-----   Message from Will Meredith Leeds, MD sent at 12/07/2018  9:18 PM EST ----- No evidence of lingula consolidation. Thyroid nodule should be forwarded to PCP to address.

## 2018-12-14 NOTE — Telephone Encounter (Signed)
lmtcb

## 2018-12-15 NOTE — Telephone Encounter (Signed)
Patient returned your call for test results.

## 2018-12-16 NOTE — Telephone Encounter (Signed)
Test result reviewed with patient. Pt tells me that his endocrinologist follows this. Will forward to PCP and endocrinologist.

## 2018-12-21 ENCOUNTER — Encounter: Payer: Self-pay | Admitting: *Deleted

## 2019-01-01 ENCOUNTER — Ambulatory Visit: Payer: PPO | Admitting: Cardiology

## 2019-01-05 DIAGNOSIS — R5383 Other fatigue: Secondary | ICD-10-CM | POA: Diagnosis not present

## 2019-01-05 DIAGNOSIS — Z0000 Encounter for general adult medical examination without abnormal findings: Secondary | ICD-10-CM | POA: Diagnosis not present

## 2019-01-05 DIAGNOSIS — I10 Essential (primary) hypertension: Secondary | ICD-10-CM | POA: Diagnosis not present

## 2019-01-05 DIAGNOSIS — Z6825 Body mass index (BMI) 25.0-25.9, adult: Secondary | ICD-10-CM | POA: Diagnosis not present

## 2019-01-05 DIAGNOSIS — I4891 Unspecified atrial fibrillation: Secondary | ICD-10-CM | POA: Diagnosis not present

## 2019-01-05 DIAGNOSIS — E785 Hyperlipidemia, unspecified: Secondary | ICD-10-CM | POA: Diagnosis not present

## 2019-01-05 DIAGNOSIS — Z1331 Encounter for screening for depression: Secondary | ICD-10-CM | POA: Diagnosis not present

## 2019-01-05 DIAGNOSIS — I259 Chronic ischemic heart disease, unspecified: Secondary | ICD-10-CM | POA: Diagnosis not present

## 2019-01-05 DIAGNOSIS — E7800 Pure hypercholesterolemia, unspecified: Secondary | ICD-10-CM | POA: Diagnosis not present

## 2019-01-05 DIAGNOSIS — Z79899 Other long term (current) drug therapy: Secondary | ICD-10-CM | POA: Diagnosis not present

## 2019-01-05 DIAGNOSIS — Z125 Encounter for screening for malignant neoplasm of prostate: Secondary | ICD-10-CM | POA: Diagnosis not present

## 2019-01-05 DIAGNOSIS — Z9181 History of falling: Secondary | ICD-10-CM | POA: Diagnosis not present

## 2019-01-14 ENCOUNTER — Telehealth: Payer: Self-pay | Admitting: *Deleted

## 2019-01-14 NOTE — Telephone Encounter (Signed)
Called patient to let them know due to recent [place] and Health Department Protocols, we are not seeing patients in the office. We are instead seeing if they would like to schedule this appointment as a Research scientist (medical) or Laptop. Unable to reach patient. LVMTCB

## 2019-01-14 NOTE — Telephone Encounter (Signed)
Patient returned call and canceled with scheduler. Patient at this time declines WebEx Virtual Visits. Message sent scheduling and nurse.

## 2019-01-25 ENCOUNTER — Telehealth (INDEPENDENT_AMBULATORY_CARE_PROVIDER_SITE_OTHER): Payer: PPO | Admitting: Cardiology

## 2019-01-25 ENCOUNTER — Other Ambulatory Visit: Payer: Self-pay

## 2019-01-25 ENCOUNTER — Ambulatory Visit: Payer: PPO | Admitting: Cardiology

## 2019-01-25 ENCOUNTER — Telehealth: Payer: Self-pay | Admitting: *Deleted

## 2019-01-25 DIAGNOSIS — I4819 Other persistent atrial fibrillation: Secondary | ICD-10-CM

## 2019-01-25 NOTE — Telephone Encounter (Signed)
Virtual Visit Pre-Appointment Phone Call  Steps For Call:  1. Confirm consent - "In the setting of the current Covid19 crisis, you are scheduled for a (phone or video) visit with your provider on (date) at (time).  Just as we do with many in-office visits, in order for you to participate in this visit, we must obtain consent.  If you'd like, I can send this to your mychart (if signed up) or email for you to review.  Otherwise, I can obtain your verbal consent now.  All virtual visits are billed to your insurance company just like a normal visit would be.  By agreeing to a virtual visit, we'd like you to understand that the technology does not allow for your provider to perform an examination, and thus may limit your provider's ability to fully assess your condition.  Finally, though the technology is pretty good, we cannot assure that it will always work on either your or our end, and in the setting of a video visit, we may have to convert it to a phone-only visit.  In either situation, we cannot ensure that we have a secure connection.  Are you willing to proceed?"  2. Give patient instructions for WebEx download to smartphone as below if video visit  3. Advise patient to be prepared with any vital sign or heart rhythm information, their current medicines, and a piece of paper and pen handy for any instructions they may receive the day of their visit  4. Inform patient they will receive a phone call 15 minutes prior to their appointment time (may be from unknown caller ID) so they should be prepared to answer  5. Confirm that appointment type is correct in Epic appointment notes (video vs telephone)    TELEPHONE CALL NOTE  Joseph [name] has been deemed a candidate for a follow-up tele-health visit to limit community exposure during the Covid-19 pandemic. I spoke with the patient via phone to ensure availability of phone/video source, confirm preferred email & phone number, and discuss  instructions and expectations.  I reminded Joseph [name] to be prepared with any vital sign and/or heart rhythm information that could potentially be obtained via home monitoring, at the time of his visit. I reminded Joseph [name] to expect a phone call at the time of his visit if his visit.  Did the patient verbally acknowledge consent to treatment? YES  Stanton Kidney, RN 01/25/2019 8:59 AM   DOWNLOADING THE WEBEX SOFTWARE TO SMARTPHONE  - If Apple, go to [employer] and type in WebEx in the search bar. [place] [employer], the blue/green circle. The app is free but as with any other app downloads, their phone may require them to verify saved payment information or Apple password. The patient does NOT have to create an account.  - If Android, ask patient to go to [employer] and type in WebEx in the search bar. [place] [employer], the blue/green circle. The app is free but as with any other app downloads, their phone may require them to verify saved payment information or Android password. The patient does NOT have to create an account.   CONSENT FOR TELE-HEALTH VISIT - PLEASE REVIEW  I hereby voluntarily request, consent and authorize [facility] and its employed or contracted physicians, physician assistants, nurse practitioners or other licensed health care professionals (the Practitioner), to provide me with telemedicine health care services (the "Services") as deemed necessary by the treating Practitioner. I acknowledge and  consent to receive the Services by the Practitioner via telemedicine. I understand that the telemedicine visit will involve communicating with the Practitioner through live audiovisual communication technology and the disclosure of certain medical information by electronic transmission. I acknowledge that I have been given the opportunity to request an in-person assessment or other available alternative prior to the telemedicine visit and am  voluntarily participating in the telemedicine visit.  I understand that I have the right to withhold or withdraw my consent to the use of telemedicine in the course of my care at any time, without affecting my right to future care or treatment, and that the Practitioner or I may terminate the telemedicine visit at any time. I understand that I have the right to inspect all information obtained and/or recorded in the course of the telemedicine visit and may receive copies of available information for a reasonable fee.  I understand that some of the potential risks of receiving the Services via telemedicine include:  Marland Kitchen Delay or interruption in medical evaluation due to technological equipment failure or disruption; . Information transmitted may not be sufficient (e.g. poor resolution of images) to allow for appropriate medical decision making by the Practitioner; and/or  . In rare instances, security protocols could fail, causing a breach of personal health information.  Furthermore, I acknowledge that it is my responsibility to provide information about my medical history, conditions and care that is complete and accurate to the best of my ability. I acknowledge that Practitioner's advice, recommendations, and/or decision may be based on factors not within their control, such as incomplete or inaccurate data provided by me or distortions of diagnostic images or specimens that may result from electronic transmissions. I understand that the practice of medicine is not an exact science and that Practitioner makes no warranties or guarantees regarding treatment outcomes. I acknowledge that I will receive a copy of this consent concurrently upon execution via email to the email address I last provided but may also request a printed copy by calling the office of [facility].    I understand that my insurance will be billed for this visit.   I have read or had this consent read to me. . I understand the  contents of this consent, which adequately explains the benefits and risks of the Services being provided via telemedicine.  . I have been provided ample opportunity to ask questions regarding this consent and the Services and have had my questions answered to my satisfaction. . I give my informed consent for the services to be provided through the use of telemedicine in my medical care  By participating in this telemedicine visit I agree to the above.

## 2019-01-25 NOTE — Telephone Encounter (Signed)
Pt cannot do virtual visit b/c he does not have a smart phone or computer/internet. Pt agreeable to telephone call w/ [place] this morning.

## 2019-01-25 NOTE — Progress Notes (Signed)
Electrophysiology TeleHealth Note   Due to national recommendations of social distancing due to COVID 19, an audio/video telehealth visit is felt to be most appropriate for this patient at this time.  Consent was obtained verbally over the phone during this telehealth visit.   Date:  01/25/2019   ID:  Joseph [name], DOB [birth year], MRN [id number]  Location: patient's home  Provider location: [address], [place]  Evaluation Performed: Follow-up visit  PCP:  Angelina Sheriff, MD  Cardiologist:  Revankar Electrophysiologist:  Dr Curt Bears  Chief Complaint:  Atrial fibrillation  History of Present Illness:    Joseph [name] is a 68 y.o. male who presents via audio/video conferencing for a telehealth visit today.  Since last being seen in our clinic, the patient reports doing very well.  Today, he denies symptoms of palpitations, chest pain, shortness of breath,  lower extremity edema, dizziness, presyncope, or syncope.  The patient is otherwise without complaint today.  The patient denies symptoms of fevers, chills, cough, or new SOB worrisome for COVID 19.  He has a history of coronary disease status post 2 MIs and a CABG in 2018, diabetes, hypertension, atrial fibrillation status post ablation 09/24/2018.  He presented to the A. fib clinic January 2020 with lower extremity edema.  He is currently getting care for the wound clinic.  This is in his right lower leg.  He had no groin issues at the time.  Today, denies symptoms of palpitations, chest pain, shortness of breath, orthopnea, PND, lower extremity edema, claudication, dizziness, presyncope, syncope, bleeding, or neurologic sequela. The patient is tolerating medications without difficulties.  He has had no further episodes of atrial fibrillation.  He is able to do much more in the way of activity without shortness of breath or fatigue.  He does have chronic shortness of breath that is mildly bothersome when he is working  in the yard, but he otherwise feels well.  Past Medical History:  Diagnosis Date  . Atrial fibrillation ([place])   . CAD (coronary artery disease)   . Diabetes mellitus ([place])   . Essential hypertension   . Pure hypercholesterolemia     Past Surgical History:  Procedure Laterality Date  . ABLATION OF DYSRHYTHMIC FOCUS    . ATRIAL FIBRILLATION ABLATION N/A 09/24/2018   Procedure: ATRIAL FIBRILLATION ABLATION;  Surgeon: Constance Haw, MD;  Location: [facility];  Service: Cardiovascular;  Laterality: N/A;  . big toe removed    . cardiac catheterization    . CORONARY STENT PLACEMENT    . tonsillectomy      Current Outpatient Medications  Medication Sig Dispense Refill  . acetaminophen (TYLENOL) 500 MG tablet Take 500 mg by mouth every 4 (four) hours as needed.     Marland Kitchen apixaban (ELIQUIS) 5 MG TABS tablet Take 1 tablet (5 mg total) by mouth 2 (two) times daily. 180 tablet 3  . aspirin EC 81 MG tablet Take 81 mg by mouth daily.    Marland Kitchen atorvastatin (LIPITOR) 40 MG tablet Take 1 tablet (40 mg total) by mouth daily. 90 tablet 3  . furosemide (LASIX) 40 MG tablet Take 1 tablet (40 mg total) by mouth daily. 30 tablet 1  . gabapentin (NEURONTIN) 300 MG capsule Take 300 mg by mouth at bedtime.     . insulin regular (NOVOLIN R,HUMULIN R) 100 units/mL injection Inject 18-20 Units into the skin 3 (three) times daily before meals.     Marland Kitchen lisinopril (PRINIVIL,ZESTRIL)  10 MG tablet Take 10 mg by mouth daily.    . metoprolol tartrate (LOPRESSOR) 50 MG tablet Take 50 mg by mouth 2 (two) times daily.    . vitamin B-12 (CYANOCOBALAMIN) 1000 MCG tablet Take 1,000 mcg by mouth 2 (two) times daily.    . nitroGLYCERIN (NITROSTAT) 0.4 MG SL tablet Place 1 tablet (0.4 mg total) under the tongue every 5 (five) minutes as needed for chest pain. (Patient not taking: Reported on 10/22/2018) 25 tablet 11   No current facility-administered medications for this visit.     Allergies:   Patient has no known  allergies.   Social History:  The patient  reports that he has quit smoking. He has never used smokeless tobacco. He reports current alcohol use. He reports that he does not use drugs.   Family History:  The patient's  family history includes Cancer in his father; Heart disease in his mother.   ROS:  Please see the history of present illness.   All other systems are personally reviewed and negative.    Exam:    Vital Signs:  There were no vitals taken for this visit.  Well appearing, alert and conversant, regular work of breathing,  good skin color Eyes- anicteric, neuro- grossly intact, skin- no apparent rash or lesions or cyanosis, mouth- oral mucosa is pink   Labs/Other Tests and Data Reviewed:    Recent Labs: 09/30/2018: ALT 39; Hemoglobin 14.5; Platelets 107 10/08/2018: BUN 21; Creatinine, Ser 1.17; Potassium 5.3; Sodium 138   Wt Readings from Last 3 Encounters:  10/22/18 186 lb (84.4 kg)  10/08/18 189 lb (85.7 kg)  09/30/18 214 lb 6.4 oz (97.3 kg)     Other studies personally reviewed: Additional studies/ records that were reviewed today include: ECG 10/22/18  Review of the above records today demonstrates:  SR, IMI   ASSESSMENT & PLAN:    1.  Paroxysmal atrial fibrillation/flutter: Status post ablation 09/24/2018.  [facility] vascular 4 and thus on Eliquis.  No obvious further episodes of atrial fibrillation.  Continue with current management.  2.  Coronary artery disease status post CABG: No current chest pain.  3.  Hypertension: Apparently has been well controlled at his primary physician's office.  We Jailine Lieder make no further changes.  4.  Hyperlipidemia: Continue atorvastatin.   COVID 19 screen The patient denies symptoms of COVID 19 at this time.  The importance of social distancing was discussed today.  Follow-up: 3 months  Current medicines are reviewed at length with the patient today.   The patient does not have concerns regarding his medicines.  The following  changes were made today:  none  Labs/ tests ordered today include:  No orders of the defined types were placed in this encounter.  Patient does not have a computer nor does he have a smart phone.  This visit was thus done via telephone.  Patient Risk:  after full review of this patients clinical status, I feel that they are at moderate risk at this time.  Today, I have spent 15 minutes with the patient with telehealth technology discussing atrial fibrillation, anticoagulation.    Signed, Shiah Berhow Meredith Leeds, MD  01/25/2019 9:31 AM     [facility] [address] [place] [address] [phone] (office) [phone] (fax)

## 2019-02-17 DIAGNOSIS — B351 Tinea unguium: Secondary | ICD-10-CM | POA: Diagnosis not present

## 2019-02-17 DIAGNOSIS — E11610 Type 2 diabetes mellitus with diabetic neuropathic arthropathy: Secondary | ICD-10-CM | POA: Diagnosis not present

## 2019-02-17 DIAGNOSIS — E1065 Type 1 diabetes mellitus with hyperglycemia: Secondary | ICD-10-CM | POA: Diagnosis not present

## 2019-02-17 DIAGNOSIS — M14672 Charcot's joint, left ankle and foot: Secondary | ICD-10-CM | POA: Diagnosis not present

## 2019-02-17 DIAGNOSIS — M14671 Charcot's joint, right ankle and foot: Secondary | ICD-10-CM | POA: Diagnosis not present

## 2019-02-17 DIAGNOSIS — L84 Corns and callosities: Secondary | ICD-10-CM | POA: Diagnosis not present

## 2019-02-17 DIAGNOSIS — E1042 Type 1 diabetes mellitus with diabetic polyneuropathy: Secondary | ICD-10-CM | POA: Diagnosis not present

## 2019-02-17 DIAGNOSIS — L603 Nail dystrophy: Secondary | ICD-10-CM | POA: Diagnosis not present

## 2019-02-17 DIAGNOSIS — Z89412 Acquired absence of left great toe: Secondary | ICD-10-CM | POA: Diagnosis not present

## 2019-03-03 DIAGNOSIS — E1165 Type 2 diabetes mellitus with hyperglycemia: Secondary | ICD-10-CM | POA: Diagnosis not present

## 2019-03-03 DIAGNOSIS — Z794 Long term (current) use of insulin: Secondary | ICD-10-CM | POA: Diagnosis not present

## 2019-03-03 DIAGNOSIS — I10 Essential (primary) hypertension: Secondary | ICD-10-CM | POA: Diagnosis not present

## 2019-03-03 DIAGNOSIS — E041 Nontoxic single thyroid nodule: Secondary | ICD-10-CM | POA: Diagnosis not present

## 2019-03-03 DIAGNOSIS — E785 Hyperlipidemia, unspecified: Secondary | ICD-10-CM | POA: Diagnosis not present

## 2019-03-03 DIAGNOSIS — E1140 Type 2 diabetes mellitus with diabetic neuropathy, unspecified: Secondary | ICD-10-CM | POA: Diagnosis not present

## 2019-03-05 DIAGNOSIS — E113213 Type 2 diabetes mellitus with mild nonproliferative diabetic retinopathy with macular edema, bilateral: Secondary | ICD-10-CM | POA: Diagnosis not present

## 2019-03-05 DIAGNOSIS — H25812 Combined forms of age-related cataract, left eye: Secondary | ICD-10-CM | POA: Diagnosis not present

## 2019-03-06 DIAGNOSIS — E113213 Type 2 diabetes mellitus with mild nonproliferative diabetic retinopathy with macular edema, bilateral: Secondary | ICD-10-CM | POA: Diagnosis not present

## 2019-03-23 ENCOUNTER — Telehealth: Payer: Self-pay

## 2019-03-23 NOTE — Telephone Encounter (Signed)
Patient called with concerns about medication asst. RN confirmed that form was faxed on 03/09/19, patient to send his portion back to company today.

## 2019-04-01 DIAGNOSIS — H25812 Combined forms of age-related cataract, left eye: Secondary | ICD-10-CM | POA: Diagnosis not present

## 2019-04-01 DIAGNOSIS — Z01818 Encounter for other preprocedural examination: Secondary | ICD-10-CM | POA: Diagnosis not present

## 2019-04-06 DIAGNOSIS — R739 Hyperglycemia, unspecified: Secondary | ICD-10-CM | POA: Diagnosis not present

## 2019-04-06 DIAGNOSIS — H25812 Combined forms of age-related cataract, left eye: Secondary | ICD-10-CM | POA: Diagnosis not present

## 2019-04-06 DIAGNOSIS — Z538 Procedure and treatment not carried out for other reasons: Secondary | ICD-10-CM | POA: Diagnosis not present

## 2019-04-08 ENCOUNTER — Telehealth: Payer: Self-pay

## 2019-04-08 ENCOUNTER — Other Ambulatory Visit: Payer: Self-pay

## 2019-04-08 MED ORDER — APIXABAN 5 MG PO TABS: 5.0000 mg | ORAL_TABLET | Freq: Two times a day (BID) | ORAL | 3 refills | Status: DC

## 2019-04-08 NOTE — Telephone Encounter (Signed)
Left message concerning eliquis application

## 2019-04-08 NOTE — Telephone Encounter (Signed)
Faxed additional paperwork back for Eliquis patient assistance.

## 2019-04-13 DIAGNOSIS — E1136 Type 2 diabetes mellitus with diabetic cataract: Secondary | ICD-10-CM | POA: Diagnosis not present

## 2019-04-13 DIAGNOSIS — Z79899 Other long term (current) drug therapy: Secondary | ICD-10-CM | POA: Diagnosis not present

## 2019-04-13 DIAGNOSIS — Z951 Presence of aortocoronary bypass graft: Secondary | ICD-10-CM | POA: Diagnosis not present

## 2019-04-13 DIAGNOSIS — H259 Unspecified age-related cataract: Secondary | ICD-10-CM | POA: Diagnosis not present

## 2019-04-13 DIAGNOSIS — E113313 Type 2 diabetes mellitus with moderate nonproliferative diabetic retinopathy with macular edema, bilateral: Secondary | ICD-10-CM | POA: Diagnosis not present

## 2019-04-13 DIAGNOSIS — H25812 Combined forms of age-related cataract, left eye: Secondary | ICD-10-CM | POA: Diagnosis not present

## 2019-04-13 DIAGNOSIS — Z955 Presence of coronary angioplasty implant and graft: Secondary | ICD-10-CM | POA: Diagnosis not present

## 2019-04-13 DIAGNOSIS — I10 Essential (primary) hypertension: Secondary | ICD-10-CM | POA: Diagnosis not present

## 2019-05-10 DIAGNOSIS — H524 Presbyopia: Secondary | ICD-10-CM | POA: Diagnosis not present

## 2019-05-10 DIAGNOSIS — E113311 Type 2 diabetes mellitus with moderate nonproliferative diabetic retinopathy with macular edema, right eye: Secondary | ICD-10-CM | POA: Diagnosis not present

## 2019-05-19 ENCOUNTER — Ambulatory Visit: Payer: PPO | Admitting: Cardiology

## 2019-05-27 ENCOUNTER — Encounter: Payer: Self-pay | Admitting: Cardiology

## 2019-05-27 ENCOUNTER — Ambulatory Visit (INDEPENDENT_AMBULATORY_CARE_PROVIDER_SITE_OTHER): Payer: PPO | Admitting: Cardiology

## 2019-05-27 ENCOUNTER — Other Ambulatory Visit: Payer: Self-pay | Admitting: Cardiology

## 2019-05-27 ENCOUNTER — Other Ambulatory Visit: Payer: Self-pay

## 2019-05-27 VITALS — BP 152/76 | HR 61 | Ht 70.0 in | Wt 191.0 lb

## 2019-05-27 DIAGNOSIS — Z9889 Other specified postprocedural states: Secondary | ICD-10-CM | POA: Diagnosis not present

## 2019-05-27 DIAGNOSIS — E1165 Type 2 diabetes mellitus with hyperglycemia: Secondary | ICD-10-CM | POA: Diagnosis not present

## 2019-05-27 DIAGNOSIS — J181 Lobar pneumonia, unspecified organism: Secondary | ICD-10-CM

## 2019-05-27 DIAGNOSIS — I2510 Atherosclerotic heart disease of native coronary artery without angina pectoris: Secondary | ICD-10-CM | POA: Diagnosis not present

## 2019-05-27 DIAGNOSIS — Z1329 Encounter for screening for other suspected endocrine disorder: Secondary | ICD-10-CM

## 2019-05-27 DIAGNOSIS — B999 Unspecified infectious disease: Secondary | ICD-10-CM

## 2019-05-27 DIAGNOSIS — E1140 Type 2 diabetes mellitus with diabetic neuropathy, unspecified: Secondary | ICD-10-CM | POA: Diagnosis not present

## 2019-05-27 DIAGNOSIS — I10 Essential (primary) hypertension: Secondary | ICD-10-CM

## 2019-05-27 DIAGNOSIS — Z8679 Personal history of other diseases of the circulatory system: Secondary | ICD-10-CM | POA: Diagnosis not present

## 2019-05-27 DIAGNOSIS — E782 Mixed hyperlipidemia: Secondary | ICD-10-CM | POA: Diagnosis not present

## 2019-05-27 DIAGNOSIS — IMO0002 Reserved for concepts with insufficient information to code with codable children: Secondary | ICD-10-CM

## 2019-05-27 NOTE — Patient Instructions (Addendum)
Medication Instructions:  Your physician has recommended you make the following change in your medication:   INCREASE lisinopril to 20 mg ( 2 tablets) once daily   If you need a refill on your cardiac medications before your next appointment, please call your pharmacy.   Lab work: Your physician recommends that you return FASTING Friday for lipids, hepatic, TSH, CBC, and BMET.  If you have labs (blood work) drawn today and your tests are completely normal, you will receive your results only by: Marland Kitchen MyChart Message (if you have MyChart) OR . A paper copy in the mail If you have any lab test that is abnormal or we need to change your treatment, we will call you to review the results.  Testing/Procedures: You had an EKG performed today.  Your physician has requested that you have an echocardiogram. Echocardiography is a painless test that uses sound waves to create images of your heart. It provides your doctor with information about the size and shape of your heart and how well your heart's chambers and valves are working. This procedure takes approximately one hour. There are no restrictions for this procedure.    Follow-Up: At [facility], you and your health needs are our priority.  As part of our continuing mission to provide you with exceptional heart care, we have created designated Provider Care Teams.  These Care Teams include your primary Cardiologist (physician) and Advanced Practice Providers (APPs -  Physician Assistants and Nurse Practitioners) who all work together to provide you with the care you need, when you need it. You will need a follow up appointment in 6 months.    Any Other Special Instructions Will Be Listed Below   Echocardiogram An echocardiogram is a procedure that uses painless sound waves (ultrasound) to produce an image of the heart. Images from an echocardiogram can provide important information about:  Signs of coronary artery disease (CAD).  Aneurysm  detection. An aneurysm is a weak or damaged part of an artery wall that bulges out from the normal force of blood pumping through the body.  Heart size and shape. Changes in the size or shape of the heart can be associated with certain conditions, including heart failure, aneurysm, and CAD.  Heart muscle function.  Heart valve function.  Signs of a past heart attack.  Fluid buildup around the heart.  Thickening of the heart muscle.  A tumor or infectious growth around the heart valves. Tell a health care provider about:  Any allergies you have.  All medicines you are taking, including vitamins, herbs, eye drops, creams, and over-the-counter medicines.  Any blood disorders you have.  Any surgeries you have had.  Any medical conditions you have.  Whether you are pregnant or may be pregnant. What are the risks? Generally, this is a safe procedure. However, problems may occur, including:  Allergic reaction to dye (contrast) that may be used during the procedure. What happens before the procedure? No specific preparation is needed. You may eat and drink normally. What happens during the procedure?   An IV tube may be inserted into one of your veins.  You may receive contrast through this tube. A contrast is an injection that improves the quality of the pictures from your heart.  A gel will be applied to your chest.  A wand-like tool (transducer) will be moved over your chest. The gel will help to transmit the sound waves from the transducer.  The sound waves will harmlessly bounce off of your heart  to allow the heart images to be captured in real-time motion. The images will be recorded on a computer. The procedure may vary among health care providers and hospitals. What happens after the procedure?  You may return to your normal, everyday life, including diet, activities, and medicines, unless your health care provider tells you not to do that. Summary  An  echocardiogram is a procedure that uses painless sound waves (ultrasound) to produce an image of the heart.  Images from an echocardiogram can provide important information about the size and shape of your heart, heart muscle function, heart valve function, and fluid buildup around your heart.  You do not need to do anything to prepare before this procedure. You may eat and drink normally.  After the echocardiogram is completed, you may return to your normal, everyday life, unless your health care provider tells you not to do that. This information is not intended to replace advice given to you by your health care provider. Make sure you discuss any questions you have with your health care provider. Document Released: 09/27/2000 Document Revised: 01/21/2019 Document Reviewed: 11/02/2016 Elsevier Patient Education  2020 [employer].

## 2019-05-27 NOTE — Progress Notes (Signed)
Cardiology Office Note:    Date:  05/27/2019   ID:  Joseph [name], DOB October 24, 1950, MRN [id number]  PCP:  Angelina Sheriff, MD  Cardiologist:  Jenean Lindau, MD   Referring MD: Angelina Sheriff, MD    ASSESSMENT:    1. Coronary artery disease involving native coronary artery of native heart without angina pectoris   2. Benign essential hypertension   3. Type 2 diabetes, uncontrolled, with neuropathy ([place])   4. S/P ablation of atrial fibrillation   5. Mixed hyperlipidemia    PLAN:    In order of problems listed above:  1. Coronary artery disease: Secondary prevention stressed with the patient.  Importance of compliance with diet and medication stressed and he vocalized understanding.  He cannot exercise much so I told him to use a bicycle or something similar for exercise. 2. Essential hypertension: Blood pressure stable.  It is mildly elevated and he was asked to go back on lisinopril 20 mg a day.  He will be back in 1 week for blood work including fasting lipids and a Chem-7 3. Mixed dyslipidemia diabetes mellitus: Diet was discussed he will be back for blood work in the next few days. 4. Moderate to severe mitral regurgitation: Echocardiogram will be done to assess the progress of this valvular pathology 5. Patient will be seen in follow-up appointment in 6 months or earlier if the patient has any concerns 6.    Medication Adjustments/Labs and Tests Ordered: Current medicines are reviewed at length with the patient today.  Concerns regarding medicines are outlined above.  No orders of the defined types were placed in this encounter.  No orders of the defined types were placed in this encounter.    No chief complaint on file.    History of Present Illness:    Joseph [name] is a 68 y.o. male.  Patient has past medical history of atrial fibrillation post ablation, coronary artery disease and stenting.  He has moderate to severe mitral regurgitation.  He leads a  sedentary lifestyle because of orthopedic issues involving the foot.  He denies any chest pain orthopnea or PND.  At the time of my evaluation, the patient is alert awake oriented and in no distress.  Past Medical History:  Diagnosis Date  . Atrial fibrillation ([place])   . CAD (coronary artery disease)   . Diabetes mellitus ([place])   . Essential hypertension   . Pure hypercholesterolemia     Past Surgical History:  Procedure Laterality Date  . ABLATION OF DYSRHYTHMIC FOCUS    . ATRIAL FIBRILLATION ABLATION N/A 09/24/2018   Procedure: ATRIAL FIBRILLATION ABLATION;  Surgeon: Constance Haw, MD;  Location: [facility];  Service: Cardiovascular;  Laterality: N/A;  . big toe removed    . cardiac catheterization    . CORONARY STENT PLACEMENT    . tonsillectomy      Current Medications: Current Meds  Medication Sig  . acetaminophen (TYLENOL) 500 MG tablet Take 500 mg by mouth every 4 (four) hours as needed.   Marland Kitchen apixaban (ELIQUIS) 5 MG TABS tablet Take 1 tablet (5 mg total) by mouth 2 (two) times daily.  Marland Kitchen aspirin EC 81 MG tablet Take 81 mg by mouth daily.  Marland Kitchen atorvastatin (LIPITOR) 40 MG tablet Take 1 tablet (40 mg total) by mouth daily.  Marland Kitchen gabapentin (NEURONTIN) 300 MG capsule Take 300 mg by mouth at bedtime.   . insulin regular (NOVOLIN R,HUMULIN R) 100 units/mL injection Inject 18-20  Units into the skin 3 (three) times daily before meals.   Marland Kitchen LEVEMIR 100 UNIT/ML injection INJECT 45 UNITS SUBCUTANEOUSLY AT NIGHT  . lisinopril (PRINIVIL,ZESTRIL) 10 MG tablet Take 10 mg by mouth daily.  . metoprolol tartrate (LOPRESSOR) 50 MG tablet Take 50 mg by mouth 2 (two) times daily.  Marland Kitchen ofloxacin (OCUFLOX) 0.3 % ophthalmic solution STARTING AFTER SURGERY INSTILL 1 DROP 4 TIMES DAILY INTO LEFT EYE FOR 7 DAYS THEN STOP  . vitamin B-12 (CYANOCOBALAMIN) 1000 MCG tablet Take 1,000 mcg by mouth 2 (two) times daily.     Allergies:   Patient has no known allergies.   Social History    Socioeconomic History  . Marital status: Unknown    Spouse name: Not on file  . Number of children: Not on file  . Years of education: Not on file  . Highest education level: Not on file  Occupational History  . Not on file  Social Needs  . Financial resource strain: Not on file  . Food insecurity    Worry: Not on file    Inability: Not on file  . Transportation needs    Medical: Not on file    Non-medical: Not on file  Tobacco Use  . Smoking status: Former Research scientist (life sciences)  . Smokeless tobacco: Never Used  Substance and Sexual Activity  . Alcohol use: Yes  . Drug use: No  . Sexual activity: Not on file  Lifestyle  . Physical activity    Days per week: Not on file    Minutes per session: Not on file  . Stress: Not on file  Relationships  . Social Herbalist on phone: Not on file    Gets together: Not on file    Attends religious service: Not on file    Active member of club or organization: Not on file    Attends meetings of clubs or organizations: Not on file    Relationship status: Not on file  Other Topics Concern  . Not on file  Social History Narrative  . Not on file     Family History: The patient's family history includes Cancer in his father; Heart disease in his mother.  ROS:   Please see the history of present illness.    All other systems reviewed and are negative.  EKGs/Labs/Other Studies Reviewed:    The following studies were reviewed today: EKG reveals sinus rhythm and nonspecific ST-T changes   Recent Labs: 09/30/2018: ALT 39; Hemoglobin 14.5; Platelets 107 10/08/2018: BUN 21; Creatinine, Ser 1.17; Potassium 5.3; Sodium 138  Recent Lipid Panel No results found for: CHOL, TRIG, HDL, CHOLHDL, VLDL, LDLCALC, LDLDIRECT  Physical Exam:    VS:  BP (!) 152/76 (BP Location: Left Arm, Patient Position: Sitting)   Pulse 61   Ht 5\' 10"  (1.778 m)   Wt 191 lb (86.6 kg)   SpO2 99%   BMI 27.41 kg/m     Wt Readings from Last 3 Encounters:   05/27/19 191 lb (86.6 kg)  10/22/18 186 lb (84.4 kg)  10/08/18 189 lb (85.7 kg)     GEN: Patient is in no acute distress HEENT: Normal NECK: No JVD; No carotid bruits LYMPHATICS: No lymphadenopathy CARDIAC: Hear sounds regular, 2/6 systolic murmur at the apex. RESPIRATORY:  Clear to auscultation without rales, wheezing or rhonchi  ABDOMEN: Soft, non-tender, non-distended MUSCULOSKELETAL:  No edema; No deformity  SKIN: Warm and dry NEUROLOGIC:  Alert and oriented x 3 PSYCHIATRIC:  Normal affect  Signed, Jenean Lindau, MD  05/27/2019 1:19 PM    [facility]

## 2019-05-28 DIAGNOSIS — Z1329 Encounter for screening for other suspected endocrine disorder: Secondary | ICD-10-CM | POA: Diagnosis not present

## 2019-05-28 DIAGNOSIS — E1165 Type 2 diabetes mellitus with hyperglycemia: Secondary | ICD-10-CM | POA: Diagnosis not present

## 2019-05-28 DIAGNOSIS — E1140 Type 2 diabetes mellitus with diabetic neuropathy, unspecified: Secondary | ICD-10-CM | POA: Diagnosis not present

## 2019-05-28 DIAGNOSIS — I10 Essential (primary) hypertension: Secondary | ICD-10-CM | POA: Diagnosis not present

## 2019-05-28 DIAGNOSIS — E782 Mixed hyperlipidemia: Secondary | ICD-10-CM | POA: Diagnosis not present

## 2019-05-28 DIAGNOSIS — Z9889 Other specified postprocedural states: Secondary | ICD-10-CM | POA: Diagnosis not present

## 2019-05-28 DIAGNOSIS — I2510 Atherosclerotic heart disease of native coronary artery without angina pectoris: Secondary | ICD-10-CM | POA: Diagnosis not present

## 2019-05-28 DIAGNOSIS — Z8679 Personal history of other diseases of the circulatory system: Secondary | ICD-10-CM | POA: Diagnosis not present

## 2019-05-29 LAB — LIPID PANEL
Chol/HDL Ratio: 3.5 ratio (ref 0.0–5.0)
Cholesterol, Total: 140 mg/dL (ref 100–199)
HDL: 40 mg/dL (ref >39)
LDL Calculated: 82 mg/dL (ref 0–99)
Triglycerides: 89 mg/dL (ref 0–149)
VLDL Cholesterol Cal: 18 mg/dL (ref 5–40)

## 2019-05-29 LAB — HEPATIC FUNCTION PANEL
ALT: 15 IU/L (ref 0–44)
AST: 20 IU/L (ref 0–40)
Albumin: 4.2 g/dL (ref 3.8–4.8)
Alkaline Phosphatase: 145 IU/L — ABNORMAL HIGH (ref 39–117)
Bilirubin Total: 0.5 mg/dL (ref 0.0–1.2)
Bilirubin, Direct: 0.15 mg/dL (ref 0.00–0.40)
Total Protein: 6.3 g/dL (ref 6.0–8.5)

## 2019-05-29 LAB — CBC
Hematocrit: 45.2 % (ref 37.5–51.0)
Hemoglobin: 14.8 g/dL (ref 13.0–17.7)
MCH: 30.5 pg (ref 26.6–33.0)
MCHC: 32.7 g/dL (ref 31.5–35.7)
MCV: 93 fL (ref 79–97)
Platelets: 154 10*3/uL (ref 150–450)
RBC: 4.85 x10E6/uL (ref 4.14–5.80)
RDW: 13 % (ref 11.6–15.4)
WBC: 5.7 10*3/uL (ref 3.4–10.8)

## 2019-05-29 LAB — BASIC METABOLIC PANEL
BUN/Creatinine Ratio: 21 (ref 10–24)
BUN: 23 mg/dL (ref 8–27)
CO2: 23 mmol/L (ref 20–29)
Calcium: 9.4 mg/dL (ref 8.6–10.2)
Chloride: 102 mmol/L (ref 96–106)
Creatinine, Ser: 1.07 mg/dL (ref 0.76–1.27)
GFR calc Af Amer: 83 mL/min/{1.73_m2} (ref >59)
GFR calc non Af Amer: 71 mL/min/{1.73_m2} (ref >59)
Glucose: 234 mg/dL — ABNORMAL HIGH (ref 65–99)
Potassium: 5.3 mmol/L — ABNORMAL HIGH (ref 3.5–5.2)
Sodium: 139 mmol/L (ref 134–144)

## 2019-05-29 LAB — TSH: TSH: 3.03 u[IU]/mL (ref 0.450–4.500)

## 2019-05-31 ENCOUNTER — Telehealth: Payer: Self-pay | Admitting: *Deleted

## 2019-05-31 DIAGNOSIS — Z89412 Acquired absence of left great toe: Secondary | ICD-10-CM | POA: Diagnosis not present

## 2019-05-31 DIAGNOSIS — I2510 Atherosclerotic heart disease of native coronary artery without angina pectoris: Secondary | ICD-10-CM

## 2019-05-31 DIAGNOSIS — L84 Corns and callosities: Secondary | ICD-10-CM | POA: Diagnosis not present

## 2019-05-31 DIAGNOSIS — Z7901 Long term (current) use of anticoagulants: Secondary | ICD-10-CM | POA: Diagnosis not present

## 2019-05-31 DIAGNOSIS — Z794 Long term (current) use of insulin: Secondary | ICD-10-CM | POA: Diagnosis not present

## 2019-05-31 DIAGNOSIS — I10 Essential (primary) hypertension: Secondary | ICD-10-CM

## 2019-05-31 DIAGNOSIS — E10610 Type 1 diabetes mellitus with diabetic neuropathic arthropathy: Secondary | ICD-10-CM | POA: Diagnosis not present

## 2019-05-31 DIAGNOSIS — E1042 Type 1 diabetes mellitus with diabetic polyneuropathy: Secondary | ICD-10-CM | POA: Diagnosis not present

## 2019-05-31 DIAGNOSIS — E1065 Type 1 diabetes mellitus with hyperglycemia: Secondary | ICD-10-CM | POA: Diagnosis not present

## 2019-05-31 DIAGNOSIS — L603 Nail dystrophy: Secondary | ICD-10-CM | POA: Diagnosis not present

## 2019-05-31 NOTE — Addendum Note (Signed)
Addended by: Particia Nearing B on: 05/31/2019 03:05 PM   Modules accepted: Orders

## 2019-05-31 NOTE — Telephone Encounter (Signed)
Patient returned call. States does not take potassium. Will come by Friday for repeat BMP

## 2019-05-31 NOTE — Telephone Encounter (Signed)
-----   Message from Jenean Lindau, MD sent at 05/31/2019 11:03 AM EDT ----- Labs are fine except potassium is elevated.  Stop potassium supplementation and recheck in 1 week.  Cc  primary care/referring physician Jenean Lindau, MD 05/31/2019 11:03 AM

## 2019-06-03 DIAGNOSIS — I10 Essential (primary) hypertension: Secondary | ICD-10-CM | POA: Diagnosis not present

## 2019-06-03 DIAGNOSIS — E785 Hyperlipidemia, unspecified: Secondary | ICD-10-CM | POA: Diagnosis not present

## 2019-06-03 DIAGNOSIS — E1140 Type 2 diabetes mellitus with diabetic neuropathy, unspecified: Secondary | ICD-10-CM | POA: Diagnosis not present

## 2019-06-03 DIAGNOSIS — Z794 Long term (current) use of insulin: Secondary | ICD-10-CM | POA: Diagnosis not present

## 2019-06-03 DIAGNOSIS — E041 Nontoxic single thyroid nodule: Secondary | ICD-10-CM | POA: Diagnosis not present

## 2019-06-04 ENCOUNTER — Telehealth: Payer: Self-pay

## 2019-06-04 DIAGNOSIS — I2510 Atherosclerotic heart disease of native coronary artery without angina pectoris: Secondary | ICD-10-CM | POA: Diagnosis not present

## 2019-06-04 DIAGNOSIS — E875 Hyperkalemia: Secondary | ICD-10-CM

## 2019-06-04 DIAGNOSIS — I10 Essential (primary) hypertension: Secondary | ICD-10-CM | POA: Diagnosis not present

## 2019-06-04 DIAGNOSIS — Z961 Presence of intraocular lens: Secondary | ICD-10-CM | POA: Diagnosis not present

## 2019-06-04 LAB — BASIC METABOLIC PANEL
BUN/Creatinine Ratio: 21 (ref 10–24)
BUN: 23 mg/dL (ref 8–27)
CO2: 25 mmol/L (ref 20–29)
Calcium: 8.9 mg/dL (ref 8.6–10.2)
Chloride: 103 mmol/L (ref 96–106)
Creatinine, Ser: 1.12 mg/dL (ref 0.76–1.27)
GFR calc Af Amer: 78 mL/min/{1.73_m2} (ref >59)
GFR calc non Af Amer: 68 mL/min/{1.73_m2} (ref >59)
Glucose: 240 mg/dL — ABNORMAL HIGH (ref 65–99)
Potassium: 5.7 mmol/L — ABNORMAL HIGH (ref 3.5–5.2)
Sodium: 140 mmol/L (ref 134–144)

## 2019-06-04 MED ORDER — SODIUM POLYSTYRENE SULFONATE PO POWD: Freq: Once | ORAL | 0 refills | Status: AC

## 2019-06-04 NOTE — Telephone Encounter (Signed)
-----   Message from Jenean Lindau, MD sent at 06/04/2019  4:44 PM EDT ----- Kayexalate 15 g 1 dose and recheck in 1 week.  Make sure that the patient is not taking any potassium supplementation and keep diet low in potassium. Jenean Lindau, MD 06/04/2019 4:43 PM

## 2019-06-04 NOTE — Telephone Encounter (Signed)
Results relayed, patient will pickup medicine today and come in for repeat labs next Friday. Low potassium diet expressed.

## 2019-06-07 ENCOUNTER — Telehealth: Payer: Self-pay

## 2019-06-07 NOTE — Telephone Encounter (Signed)
Pharmacy does not have kayexalate powder in stock. Substituting liquid instead.

## 2019-06-11 DIAGNOSIS — E875 Hyperkalemia: Secondary | ICD-10-CM | POA: Diagnosis not present

## 2019-06-11 LAB — BASIC METABOLIC PANEL
BUN/Creatinine Ratio: 22 (ref 10–24)
BUN: 24 mg/dL (ref 8–27)
CO2: 23 mmol/L (ref 20–29)
Calcium: 9.3 mg/dL (ref 8.6–10.2)
Chloride: 102 mmol/L (ref 96–106)
Creatinine, Ser: 1.08 mg/dL (ref 0.76–1.27)
GFR calc Af Amer: 82 mL/min/{1.73_m2} (ref >59)
GFR calc non Af Amer: 71 mL/min/{1.73_m2} (ref >59)
Glucose: 206 mg/dL — ABNORMAL HIGH (ref 65–99)
Potassium: 5 mmol/L (ref 3.5–5.2)
Sodium: 140 mmol/L (ref 134–144)

## 2019-06-15 ENCOUNTER — Encounter: Payer: Self-pay | Admitting: *Deleted

## 2019-06-17 ENCOUNTER — Other Ambulatory Visit: Payer: Self-pay

## 2019-06-17 ENCOUNTER — Ambulatory Visit (INDEPENDENT_AMBULATORY_CARE_PROVIDER_SITE_OTHER): Payer: PPO

## 2019-06-17 DIAGNOSIS — I2510 Atherosclerotic heart disease of native coronary artery without angina pectoris: Secondary | ICD-10-CM | POA: Diagnosis not present

## 2019-06-17 DIAGNOSIS — I10 Essential (primary) hypertension: Secondary | ICD-10-CM

## 2019-06-17 NOTE — Progress Notes (Signed)
Complete echocardiogram has been performed.  Jimmy Starla Deller RDCS, RVT 

## 2019-06-23 ENCOUNTER — Telehealth: Payer: Self-pay

## 2019-06-23 NOTE — Telephone Encounter (Signed)
Results relayed, copy sent to Dr. Lin Landsman per Dr. Docia Furl request.

## 2019-06-23 NOTE — Telephone Encounter (Signed)
-----   Message from Jenean Lindau, MD sent at 06/18/2019  1:38 PM EDT ----- The results of the study is unremarkable. Please inform patient. I will discuss in detail at next appointment. Cc  primary care/referring physician Jenean Lindau, MD 06/18/2019 1:37 PMd

## 2019-07-26 ENCOUNTER — Other Ambulatory Visit: Payer: Self-pay | Admitting: Cardiology

## 2019-08-06 DIAGNOSIS — E7800 Pure hypercholesterolemia, unspecified: Secondary | ICD-10-CM | POA: Diagnosis not present

## 2019-08-06 DIAGNOSIS — Z2821 Immunization not carried out because of patient refusal: Secondary | ICD-10-CM | POA: Diagnosis not present

## 2019-08-06 DIAGNOSIS — I10 Essential (primary) hypertension: Secondary | ICD-10-CM | POA: Diagnosis not present

## 2019-08-06 DIAGNOSIS — Z6826 Body mass index (BMI) 26.0-26.9, adult: Secondary | ICD-10-CM | POA: Diagnosis not present

## 2019-08-06 DIAGNOSIS — S98139A Complete traumatic amputation of one unspecified lesser toe, initial encounter: Secondary | ICD-10-CM | POA: Diagnosis not present

## 2019-08-16 DIAGNOSIS — E113293 Type 2 diabetes mellitus with mild nonproliferative diabetic retinopathy without macular edema, bilateral: Secondary | ICD-10-CM | POA: Diagnosis not present

## 2019-08-26 ENCOUNTER — Telehealth: Payer: Self-pay

## 2019-08-26 NOTE — Telephone Encounter (Signed)
Faxed patient asst information for Eliquis

## 2019-09-01 DIAGNOSIS — E119 Type 2 diabetes mellitus without complications: Secondary | ICD-10-CM | POA: Diagnosis not present

## 2019-09-01 DIAGNOSIS — I2510 Atherosclerotic heart disease of native coronary artery without angina pectoris: Secondary | ICD-10-CM | POA: Diagnosis not present

## 2019-09-01 DIAGNOSIS — R110 Nausea: Secondary | ICD-10-CM | POA: Diagnosis not present

## 2019-09-01 DIAGNOSIS — K8010 Calculus of gallbladder with chronic cholecystitis without obstruction: Secondary | ICD-10-CM | POA: Diagnosis not present

## 2019-09-03 DIAGNOSIS — E1140 Type 2 diabetes mellitus with diabetic neuropathy, unspecified: Secondary | ICD-10-CM | POA: Diagnosis not present

## 2019-09-03 DIAGNOSIS — E785 Hyperlipidemia, unspecified: Secondary | ICD-10-CM | POA: Diagnosis not present

## 2019-09-03 DIAGNOSIS — I10 Essential (primary) hypertension: Secondary | ICD-10-CM | POA: Diagnosis not present

## 2019-09-03 DIAGNOSIS — E1165 Type 2 diabetes mellitus with hyperglycemia: Secondary | ICD-10-CM | POA: Diagnosis not present

## 2019-09-03 DIAGNOSIS — Z794 Long term (current) use of insulin: Secondary | ICD-10-CM | POA: Diagnosis not present

## 2019-09-06 ENCOUNTER — Encounter: Payer: Self-pay | Admitting: Cardiology

## 2019-09-06 ENCOUNTER — Other Ambulatory Visit: Payer: Self-pay

## 2019-09-06 ENCOUNTER — Ambulatory Visit (INDEPENDENT_AMBULATORY_CARE_PROVIDER_SITE_OTHER): Payer: PPO | Admitting: Cardiology

## 2019-09-06 VITALS — BP 148/84 | HR 63 | Ht 70.0 in | Wt 195.6 lb

## 2019-09-06 DIAGNOSIS — I340 Nonrheumatic mitral (valve) insufficiency: Secondary | ICD-10-CM | POA: Diagnosis not present

## 2019-09-06 DIAGNOSIS — Z8679 Personal history of other diseases of the circulatory system: Secondary | ICD-10-CM

## 2019-09-06 DIAGNOSIS — I10 Essential (primary) hypertension: Secondary | ICD-10-CM | POA: Diagnosis not present

## 2019-09-06 DIAGNOSIS — Z9889 Other specified postprocedural states: Secondary | ICD-10-CM

## 2019-09-06 DIAGNOSIS — Z01818 Encounter for other preprocedural examination: Secondary | ICD-10-CM

## 2019-09-06 DIAGNOSIS — I2510 Atherosclerotic heart disease of native coronary artery without angina pectoris: Secondary | ICD-10-CM | POA: Diagnosis not present

## 2019-09-06 DIAGNOSIS — E782 Mixed hyperlipidemia: Secondary | ICD-10-CM

## 2019-09-06 NOTE — Progress Notes (Addendum)
Cardiology Office Note:    Date:  09/06/2019   ID:  Joseph [name], DOB 04-22-1951, MRN [id number]  PCP:  Angelina Sheriff, MD  Cardiologist:  Jenean Lindau, MD   Referring MD: Angelina Sheriff, MD    ASSESSMENT:    1. Coronary artery disease involving native coronary artery of native heart without angina pectoris   2. Benign essential hypertension   3. Mixed hyperlipidemia   4. Moderate mitral regurgitation   5. S/P ablation of atrial fibrillation    PLAN:    In order of problems listed above:  1. Preop cardiovascular stratification: Patient is planning to undergo gallbladder surgery.  He has coronary artery disease and diabetes mellitus.  He leads a sedentary lifestyle.  In view of this I would like to do a Lexiscan sestamibi to assess his risk.  If this is negative then he is not at high risk for coronary events during the aforementioned surgery.  Meticulous hemodynamic monitoring and uninterrupted continuous beta-blockade will further reduce the risk of coronary events. 2. Essential hypertension: Blood pressure is stable she has an element of white coat hypertension 3. Mixed dyslipidemia: Lipids were reviewed from recently and found to be fine 4. Diabetes mellitus: Managed by his primary care physician 5. Atrial fibrillation post ablation: Continue anticoagulation.  This may be withheld for the shortest period of time as found to be okay by his surgeon. 6. Patient will be seen in follow-up appointment in 6 months or earlier if the patient has any concerns   Addendum: The patient called [place] back after the stress testing to inform us about his blood pressure and it was fine.  Stress test did not reveal any evidence of ischemia and the ejection fraction was preserved.  Based on this information the patient is not at high risk for coronary events during the aforementioned surgery.  Please follow recommendations as mentioned above. Signed Jyl Heinz MD    Medication  Adjustments/Labs and Tests Ordered: Current medicines are reviewed at length with the patient today.  Concerns regarding medicines are outlined above.  No orders of the defined types were placed in this encounter.  No orders of the defined types were placed in this encounter.    No chief complaint on file.    History of Present Illness:    Joseph [name] is a 68 y.o. male.  Patient has past medical history of coronary artery disease post CABG surgery, essential hypertension, mixed dyslipidemia and diabetes mellitus.  He has paroxysmal atrial fibrillation post ablation.  He denies any problems at this time and takes care of activities of daily living.  No chest pain orthopnea or PND.  At the time of my evaluation, the patient is alert awake oriented and in no distress.  Past Medical History:  Diagnosis Date  . Atrial fibrillation ([place])   . CAD (coronary artery disease)   . Diabetes mellitus ([place])   . Essential hypertension   . Pure hypercholesterolemia     Past Surgical History:  Procedure Laterality Date  . ABLATION OF DYSRHYTHMIC FOCUS    . ATRIAL FIBRILLATION ABLATION N/A 09/24/2018   Procedure: ATRIAL FIBRILLATION ABLATION;  Surgeon: Constance Haw, MD;  Location: [facility];  Service: Cardiovascular;  Laterality: N/A;  . big toe removed    . cardiac catheterization    . CORONARY STENT PLACEMENT    . tonsillectomy      Current Medications: Current Meds  Medication Sig  . acetaminophen (TYLENOL) 500  MG tablet Take 500 mg by mouth every 4 (four) hours as needed.   Marland Kitchen apixaban (ELIQUIS) 5 MG TABS tablet Take 1 tablet (5 mg total) by mouth 2 (two) times daily.  Marland Kitchen aspirin EC 81 MG tablet Take 81 mg by mouth daily.  Marland Kitchen atorvastatin (LIPITOR) 40 MG tablet Take 1 tablet by mouth once daily  . B12-ACTIVE 1 MG CHEW   . gabapentin (NEURONTIN) 300 MG capsule Take 300 mg by mouth at bedtime.   . Insulin Aspart FlexPen 100 UNIT/ML SOPN INJECT UP TO 20 UNITS SUBCUTANEOUSLY  THREE TIMES DAILY WITH MEALS (MDD 60 UNITS)  . insulin regular (NOVOLIN R,HUMULIN R) 100 units/mL injection Inject 18-20 Units into the skin 3 (three) times daily before meals.   Marland Kitchen LEVEMIR 100 UNIT/ML injection 30 Units.   Marland Kitchen lisinopril (PRINIVIL,ZESTRIL) 10 MG tablet Take 20 mg by mouth daily.  . metoprolol tartrate (LOPRESSOR) 50 MG tablet Take 50 mg by mouth 2 (two) times daily.  Marland Kitchen ofloxacin (OCUFLOX) 0.3 % ophthalmic solution STARTING AFTER SURGERY INSTILL 1 DROP 4 TIMES DAILY INTO LEFT EYE FOR 7 DAYS THEN STOP  . [DISCONTINUED] vitamin B-12 (CYANOCOBALAMIN) 1000 MCG tablet Take 1,000 mcg by mouth 2 (two) times daily.     Allergies:   Patient has no known allergies.   Social History   Socioeconomic History  . Marital status: Unknown    Spouse name: Not on file  . Number of children: Not on file  . Years of education: Not on file  . Highest education level: Not on file  Occupational History  . Not on file  Social Needs  . Financial resource strain: Not on file  . Food insecurity    Worry: Not on file    Inability: Not on file  . Transportation needs    Medical: Not on file    Non-medical: Not on file  Tobacco Use  . Smoking status: Former Research scientist (life sciences)  . Smokeless tobacco: Never Used  Substance and Sexual Activity  . Alcohol use: Yes  . Drug use: No  . Sexual activity: Not on file  Lifestyle  . Physical activity    Days per week: Not on file    Minutes per session: Not on file  . Stress: Not on file  Relationships  . Social Herbalist on phone: Not on file    Gets together: Not on file    Attends religious service: Not on file    Active member of club or organization: Not on file    Attends meetings of clubs or organizations: Not on file    Relationship status: Not on file  Other Topics Concern  . Not on file  Social History Narrative  . Not on file     Family History: The patient's family history includes Cancer in his father; Heart disease in his  mother.  ROS:   Please see the history of present illness.    All other systems reviewed and are negative.  EKGs/Labs/Other Studies Reviewed:    The following studies were reviewed today: EKG reveals sinus rhythm and nonspecific ST-T changes.   Recent Labs: 05/28/2019: ALT 15; Hemoglobin 14.8; Platelets 154; TSH 3.030 06/11/2019: BUN 24; Creatinine, Ser 1.08; Potassium 5.0; Sodium 140  Recent Lipid Panel    Component Value Date/Time   CHOL 140 05/28/2019 0825   TRIG 89 05/28/2019 0825   HDL 40 05/28/2019 0825   CHOLHDL 3.5 05/28/2019 0825   LDLCALC 82 05/28/2019 0825  Physical Exam:    VS:  BP (!) 148/84 (BP Location: Left Arm, Patient Position: Sitting, Cuff Size: Normal)   Pulse 63   Ht 5\' 10"  (1.778 m)   Wt 195 lb 9.6 oz (88.7 kg)   SpO2 96%   BMI 28.07 kg/m     Wt Readings from Last 3 Encounters:  09/06/19 195 lb 9.6 oz (88.7 kg)  05/27/19 191 lb (86.6 kg)  10/22/18 186 lb (84.4 kg)     GEN: Patient is in no acute distress HEENT: Normal NECK: No JVD; No carotid bruits LYMPHATICS: No lymphadenopathy CARDIAC: Hear sounds regular, 2/6 systolic murmur at the apex. RESPIRATORY:  Clear to auscultation without rales, wheezing or rhonchi  ABDOMEN: Soft, non-tender, non-distended MUSCULOSKELETAL:  No edema; No deformity  SKIN: Warm and dry NEUROLOGIC:  Alert and oriented x 3 PSYCHIATRIC:  Normal affect   Signed, Jenean Lindau, MD  09/06/2019 8:22 AM    [facility]

## 2019-09-06 NOTE — Patient Instructions (Signed)
Medication Instructions:  Your physician recommends that you continue on your current medications as directed. Please refer to the Current Medication list given to you today.  *If you need a refill on your cardiac medications before your next appointment, please call your pharmacy*  Lab Work: NONE If you have labs (blood work) drawn today and your tests are completely normal, you will receive your results only by: Marland Kitchen MyChart Message (if you have MyChart) OR . A paper copy in the mail If you have any lab test that is abnormal or we need to change your treatment, we will call you to review the results.  Testing/Procedures: Your physician has requested that you have a lexiscan myoview. For further information please visit [email or web address]. Please follow instruction sheet, as given.    Follow-Up: At [facility], you and your health needs are our priority.  As part of our continuing mission to provide you with exceptional heart care, we have created designated Provider Care Teams.  These Care Teams include your primary Cardiologist (physician) and Advanced Practice Providers (APPs -  Physician Assistants and Nurse Practitioners) who all work together to provide you with the care you need, when you need it.  Your next appointment:   6 month(s)  The format for your next appointment:   In Person  Provider:   Jyl Heinz, MD  Other Instructions Regadenoson injection What is this medicine? REGADENOSON is used to test the heart for coronary artery disease. It is used in patients who can not exercise for their stress test. This medicine may be used for other purposes; ask your health care provider or pharmacist if you have questions. COMMON BRAND NAME(S): Lexiscan What should I tell my health care provider before I take this medicine? They need to know if you have any of these conditions:  heart problems  lung or breathing disease, like asthma or COPD  an unusual or allergic  reaction to regadenoson, other medicines, foods, dyes, or preservatives  pregnant or trying to get pregnant  breast-feeding How should I use this medicine? This medicine is for injection into a vein. It is given by a health care professional in a hospital or clinic setting. Talk to your pediatrician regarding the use of this medicine in children. Special care may be needed. Overdosage: If you think you have taken too much of this medicine contact a poison control center or emergency room at once. NOTE: This medicine is only for you. Do not share this medicine with others. What if I miss a dose? This does not apply. What may interact with this medicine?  caffeine  dipyridamole  guarana  theophylline This list may not describe all possible interactions. Give your health care provider a list of all the medicines, herbs, non-prescription drugs, or dietary supplements you use. Also tell them if you smoke, drink alcohol, or use illegal drugs. Some items may interact with your medicine. What should I watch for while using this medicine? Your condition will be monitored carefully while you are receiving this medicine. Do not take medicines, foods, or drinks with caffeine (like coffee, tea, or colas) for at least 12 hours before your test. If you do not know if something contains caffeine, ask your health care professional. What side effects may I notice from receiving this medicine? Side effects that you should report to your doctor or health care professional as soon as possible:  allergic reactions like skin rash, itching or hives, swelling of the face, lips, or tongue  breathing  problems  chest pain, tightness or palpitations  severe headache Side effects that usually do not require medical attention (report to your doctor or health care professional if they continue or are bothersome):  flushing  headache  irritation or pain at site where injected  nausea, vomiting This list  may not describe all possible side effects. Call your doctor for medical advice about side effects. You may report side effects to FDA at 1-800-FDA-1088. Where should I keep my medicine? This drug is given in a hospital or clinic and will not be stored at home. NOTE: This sheet is a summary. It may not cover all possible information. If you have questions about this medicine, talk to your doctor, pharmacist, or health care provider.  2020 Elsevier/Gold Standard (2008-05-30 15:08:13)  Cardiac Nuclear Scan A cardiac nuclear scan is a test that is done to check the flow of blood to your heart. It is done when you are resting and when you are exercising. The test looks for problems such as:  Not enough blood reaching a portion of the heart.  The heart muscle not working as it should. You may need this test if:  You have heart disease.  You have had lab results that are not normal.  You have had heart surgery or a balloon procedure to open up blocked arteries (angioplasty).  You have chest pain.  You have shortness of breath. In this test, a special dye (tracer) is put into your bloodstream. The tracer will travel to your heart. A camera will then take pictures of your heart to see how the tracer moves through your heart. This test is usually done at a hospital and takes 2-4 hours. Tell a doctor about:  Any allergies you have.  All medicines you are taking, including vitamins, herbs, eye drops, creams, and over-the-counter medicines.  Any problems you or family members have had with anesthetic medicines.  Any blood disorders you have.  Any surgeries you have had.  Any medical conditions you have.  Whether you are pregnant or may be pregnant. What are the risks? Generally, this is a safe test. However, problems may occur, such as:  Serious chest pain and heart attack. This is only a risk if the stress portion of the test is done.  Rapid heartbeat.  A feeling of warmth in your  chest. This feeling usually does not last long.  Allergic reaction to the tracer. What happens before the test?  Ask your doctor about changing or stopping your normal medicines. This is important.  Follow instructions from your doctor about what you cannot eat or drink.  Remove your jewelry on the day of the test. What happens during the test?  An IV tube will be inserted into one of your veins.  Your doctor will give you a small amount of tracer through the IV tube.  You will wait for 20-40 minutes while the tracer moves through your bloodstream.  Your heart will be monitored with an electrocardiogram (ECG).  You will lie down on an exam table.  Pictures of your heart will be taken for about 15-20 minutes.  You may also have a stress test. For this test, one of these things may be done: ? You will be asked to exercise on a treadmill or a stationary bike. ? You will be given medicines that will make your heart work harder. This is done if you are unable to exercise.  When blood flow to your heart has peaked, a tracer  will again be given through the IV tube.  After 20-40 minutes, you will get back on the exam table. More pictures will be taken of your heart.  Depending on the tracer that is used, more pictures may need to be taken 3-4 hours later.  Your IV tube will be removed when the test is over. The test may vary among doctors and hospitals. What happens after the test?  Ask your doctor: ? Whether you can return to your normal schedule, including diet, activities, and medicines. ? Whether you should drink more fluids. This will help to remove the tracer from your body. Drink enough fluid to keep your pee (urine) pale yellow.  Ask your doctor, or the department that is doing the test: ? When will my results be ready? ? How will I get my results? Summary  A cardiac nuclear scan is a test that is done to check the flow of blood to your heart.  Tell your doctor  whether you are pregnant or may be pregnant.  Before the test, ask your doctor about changing or stopping your normal medicines. This is important.  Ask your doctor whether you can return to your normal activities. You may be asked to drink more fluids. This information is not intended to replace advice given to you by your health care provider. Make sure you discuss any questions you have with your health care provider. Document Released: 03/16/2018 Document Revised: 01/20/2019 Document Reviewed: 03/16/2018 Elsevier Patient Education  2020 [employer].

## 2019-09-15 ENCOUNTER — Telehealth: Payer: Self-pay | Admitting: *Deleted

## 2019-09-15 NOTE — Telephone Encounter (Signed)
Left message on voicemail per DPR in reference to upcoming appointment scheduled on 09/21/2019 at [facility] with detailed instructions given per Myocardial Perfusion Study Information Sheet for the test. LM to arrive 15 minutes early, and that it is imperative to arrive on time for appointment to keep from having the test rescheduled. If you need to cancel or reschedule your appointment, please call the office within 24 hours of your appointment. Failure to do so may result in a cancellation of your appointment, and a $50 no show fee. Phone number given for call back for any questions. Marlys Stegmaier, Ranae Palms No my chart available

## 2019-09-21 ENCOUNTER — Telehealth: Payer: Self-pay | Admitting: Cardiology

## 2019-09-21 ENCOUNTER — Ambulatory Visit (INDEPENDENT_AMBULATORY_CARE_PROVIDER_SITE_OTHER): Payer: PPO

## 2019-09-21 ENCOUNTER — Other Ambulatory Visit: Payer: Self-pay

## 2019-09-21 VITALS — Ht 70.0 in | Wt 195.0 lb

## 2019-09-21 DIAGNOSIS — Z01818 Encounter for other preprocedural examination: Secondary | ICD-10-CM

## 2019-09-21 DIAGNOSIS — I2510 Atherosclerotic heart disease of native coronary artery without angina pectoris: Secondary | ICD-10-CM

## 2019-09-21 DIAGNOSIS — E782 Mixed hyperlipidemia: Secondary | ICD-10-CM | POA: Diagnosis not present

## 2019-09-21 MED ORDER — REGADENOSON 0.4 MG/5ML IV SOLN
0.4000 mg | Freq: Once | INTRAVENOUS | Status: AC
  Administered 2019-09-21: 0.4 mg via INTRAVENOUS

## 2019-09-21 MED ORDER — TECHNETIUM TC 99M TETROFOSMIN IV KIT
27.1000 | PACK | Freq: Once | INTRAVENOUS | Status: AC | PRN
  Administered 2019-09-21: 27.1 via INTRAVENOUS

## 2019-09-21 MED ORDER — TECHNETIUM TC 99M TETROFOSMIN IV KIT
8.4000 | PACK | Freq: Once | INTRAVENOUS | Status: AC | PRN
  Administered 2019-09-21: 8.4 via INTRAVENOUS

## 2019-09-21 NOTE — Telephone Encounter (Signed)
Patient called stating his BP today was 120/67

## 2019-09-22 LAB — MYOCARDIAL PERFUSION IMAGING
LV dias vol: 138 mL (ref 62–150)
LV sys vol: 70 mL
Peak HR: 89 {beats}/min
Rest HR: 78 {beats}/min
SDS: 4
SRS: 6
SSS: 10
TID: 1.15

## 2019-09-22 NOTE — Telephone Encounter (Signed)
Patient informed of results, cardiac clearance note routed to Dr. Amalia Hailey via [place]. RN spoke with Dr. Amalia Hailey nurse Juanda Crumble and made her aware that clearance has been sent. Copy also sent to Dr. Lin Landsman

## 2019-09-22 NOTE — Telephone Encounter (Signed)
-----   Message from Jenean Lindau, MD sent at 09/22/2019  8:51 AM EST ----- The results of the study is unremarkable. Please inform patient. I will discuss in detail at next appointment. Cc  primary care/referring physician Jenean Lindau, MD 09/22/2019 8:51 AM

## 2019-10-04 DIAGNOSIS — Z20828 Contact with and (suspected) exposure to other viral communicable diseases: Secondary | ICD-10-CM | POA: Diagnosis not present

## 2019-10-04 DIAGNOSIS — K8010 Calculus of gallbladder with chronic cholecystitis without obstruction: Secondary | ICD-10-CM | POA: Diagnosis not present

## 2019-10-04 DIAGNOSIS — Z01812 Encounter for preprocedural laboratory examination: Secondary | ICD-10-CM | POA: Diagnosis not present

## 2019-10-05 ENCOUNTER — Telehealth: Payer: Self-pay | Admitting: Cardiology

## 2019-10-05 DIAGNOSIS — Z01812 Encounter for preprocedural laboratory examination: Secondary | ICD-10-CM | POA: Diagnosis not present

## 2019-10-05 DIAGNOSIS — K8010 Calculus of gallbladder with chronic cholecystitis without obstruction: Secondary | ICD-10-CM | POA: Diagnosis not present

## 2019-10-05 NOTE — Telephone Encounter (Signed)
Copy of EKG faxed.

## 2019-10-05 NOTE — Telephone Encounter (Signed)
New Message     Cecille Rubin is calling from [facility] and is requesting a copy of the pts last EKG from 11/23 to be faxed   Fax # [phone]  She is asking for this information to be sent today

## 2019-10-11 DIAGNOSIS — E1136 Type 2 diabetes mellitus with diabetic cataract: Secondary | ICD-10-CM | POA: Diagnosis not present

## 2019-10-11 DIAGNOSIS — Z794 Long term (current) use of insulin: Secondary | ICD-10-CM | POA: Diagnosis not present

## 2019-10-11 DIAGNOSIS — R197 Diarrhea, unspecified: Secondary | ICD-10-CM | POA: Diagnosis not present

## 2019-10-11 DIAGNOSIS — K8010 Calculus of gallbladder with chronic cholecystitis without obstruction: Secondary | ICD-10-CM | POA: Diagnosis not present

## 2019-10-11 DIAGNOSIS — Z8719 Personal history of other diseases of the digestive system: Secondary | ICD-10-CM | POA: Diagnosis not present

## 2019-10-11 DIAGNOSIS — I2510 Atherosclerotic heart disease of native coronary artery without angina pectoris: Secondary | ICD-10-CM | POA: Diagnosis not present

## 2019-10-11 DIAGNOSIS — Z87891 Personal history of nicotine dependence: Secondary | ICD-10-CM | POA: Diagnosis not present

## 2019-10-11 DIAGNOSIS — R0602 Shortness of breath: Secondary | ICD-10-CM | POA: Diagnosis not present

## 2019-10-11 DIAGNOSIS — R110 Nausea: Secondary | ICD-10-CM | POA: Diagnosis not present

## 2019-10-11 DIAGNOSIS — K8018 Calculus of gallbladder with other cholecystitis without obstruction: Secondary | ICD-10-CM | POA: Diagnosis not present

## 2019-10-27 ENCOUNTER — Other Ambulatory Visit: Payer: Self-pay | Admitting: Cardiology

## 2019-11-12 ENCOUNTER — Telehealth: Payer: Self-pay | Admitting: Cardiology

## 2019-11-12 ENCOUNTER — Other Ambulatory Visit: Payer: Self-pay | Admitting: *Deleted

## 2019-11-12 DIAGNOSIS — E1040 Type 1 diabetes mellitus with diabetic neuropathy, unspecified: Secondary | ICD-10-CM | POA: Diagnosis not present

## 2019-11-12 DIAGNOSIS — E1065 Type 1 diabetes mellitus with hyperglycemia: Secondary | ICD-10-CM | POA: Diagnosis not present

## 2019-11-12 DIAGNOSIS — L97422 Non-pressure chronic ulcer of left heel and midfoot with fat layer exposed: Secondary | ICD-10-CM | POA: Diagnosis not present

## 2019-11-12 DIAGNOSIS — E11610 Type 2 diabetes mellitus with diabetic neuropathic arthropathy: Secondary | ICD-10-CM | POA: Diagnosis not present

## 2019-11-12 DIAGNOSIS — E10621 Type 1 diabetes mellitus with foot ulcer: Secondary | ICD-10-CM | POA: Diagnosis not present

## 2019-11-12 DIAGNOSIS — Z89412 Acquired absence of left great toe: Secondary | ICD-10-CM | POA: Diagnosis not present

## 2019-11-12 MED ORDER — APIXABAN 5 MG PO TABS: 5.0000 mg | ORAL_TABLET | Freq: Two times a day (BID) | ORAL | 1 refills | Status: DC

## 2019-11-12 NOTE — Telephone Encounter (Signed)
New Message    *STAT* If patient is at the pharmacy, call can be transferred to refill team.   1. Which medications need to be refilled? (please list name of each medication and dose if known) apixaban (ELIQUIS) 5 MG TABS tablet    2. Which pharmacy/location (including street and city if local pharmacy) is medication to be sent to? [place], [place]  3. Do they need a 30 day or 90 day supply? [place]

## 2019-11-12 NOTE — Telephone Encounter (Signed)
REfill sent. 

## 2019-11-26 DIAGNOSIS — L97422 Non-pressure chronic ulcer of left heel and midfoot with fat layer exposed: Secondary | ICD-10-CM | POA: Diagnosis not present

## 2019-11-26 DIAGNOSIS — E1142 Type 2 diabetes mellitus with diabetic polyneuropathy: Secondary | ICD-10-CM | POA: Diagnosis not present

## 2019-11-26 DIAGNOSIS — Z89412 Acquired absence of left great toe: Secondary | ICD-10-CM | POA: Diagnosis not present

## 2019-11-26 DIAGNOSIS — E11621 Type 2 diabetes mellitus with foot ulcer: Secondary | ICD-10-CM | POA: Diagnosis not present

## 2019-11-26 DIAGNOSIS — E11610 Type 2 diabetes mellitus with diabetic neuropathic arthropathy: Secondary | ICD-10-CM | POA: Diagnosis not present

## 2019-11-27 DIAGNOSIS — E1142 Type 2 diabetes mellitus with diabetic polyneuropathy: Secondary | ICD-10-CM | POA: Insufficient documentation

## 2019-11-27 HISTORY — DX: Type 2 diabetes mellitus with diabetic polyneuropathy: E11.42

## 2019-12-10 DIAGNOSIS — L97422 Non-pressure chronic ulcer of left heel and midfoot with fat layer exposed: Secondary | ICD-10-CM | POA: Diagnosis not present

## 2019-12-10 DIAGNOSIS — Z89412 Acquired absence of left great toe: Secondary | ICD-10-CM | POA: Diagnosis not present

## 2019-12-10 DIAGNOSIS — E11621 Type 2 diabetes mellitus with foot ulcer: Secondary | ICD-10-CM | POA: Insufficient documentation

## 2019-12-10 DIAGNOSIS — E11610 Type 2 diabetes mellitus with diabetic neuropathic arthropathy: Secondary | ICD-10-CM | POA: Diagnosis not present

## 2019-12-10 DIAGNOSIS — E1165 Type 2 diabetes mellitus with hyperglycemia: Secondary | ICD-10-CM | POA: Diagnosis not present

## 2019-12-10 DIAGNOSIS — E1140 Type 2 diabetes mellitus with diabetic neuropathy, unspecified: Secondary | ICD-10-CM | POA: Diagnosis not present

## 2019-12-10 DIAGNOSIS — L97412 Non-pressure chronic ulcer of right heel and midfoot with fat layer exposed: Secondary | ICD-10-CM | POA: Diagnosis not present

## 2019-12-10 HISTORY — DX: Type 2 diabetes mellitus with foot ulcer: E11.621

## 2019-12-31 DIAGNOSIS — E113393 Type 2 diabetes mellitus with moderate nonproliferative diabetic retinopathy without macular edema, bilateral: Secondary | ICD-10-CM | POA: Diagnosis not present

## 2020-01-10 DIAGNOSIS — Z89412 Acquired absence of left great toe: Secondary | ICD-10-CM | POA: Diagnosis not present

## 2020-01-10 DIAGNOSIS — E1140 Type 2 diabetes mellitus with diabetic neuropathy, unspecified: Secondary | ICD-10-CM | POA: Diagnosis not present

## 2020-01-10 DIAGNOSIS — E11621 Type 2 diabetes mellitus with foot ulcer: Secondary | ICD-10-CM | POA: Diagnosis not present

## 2020-01-10 DIAGNOSIS — L97422 Non-pressure chronic ulcer of left heel and midfoot with fat layer exposed: Secondary | ICD-10-CM | POA: Diagnosis not present

## 2020-01-10 DIAGNOSIS — E1165 Type 2 diabetes mellitus with hyperglycemia: Secondary | ICD-10-CM | POA: Diagnosis not present

## 2020-01-10 DIAGNOSIS — E11610 Type 2 diabetes mellitus with diabetic neuropathic arthropathy: Secondary | ICD-10-CM | POA: Diagnosis not present

## 2020-01-26 DIAGNOSIS — Z89412 Acquired absence of left great toe: Secondary | ICD-10-CM | POA: Diagnosis not present

## 2020-01-26 DIAGNOSIS — E11610 Type 2 diabetes mellitus with diabetic neuropathic arthropathy: Secondary | ICD-10-CM | POA: Diagnosis not present

## 2020-01-26 DIAGNOSIS — E11621 Type 2 diabetes mellitus with foot ulcer: Secondary | ICD-10-CM | POA: Diagnosis not present

## 2020-01-26 DIAGNOSIS — L97422 Non-pressure chronic ulcer of left heel and midfoot with fat layer exposed: Secondary | ICD-10-CM | POA: Diagnosis not present

## 2020-01-26 DIAGNOSIS — E1165 Type 2 diabetes mellitus with hyperglycemia: Secondary | ICD-10-CM | POA: Diagnosis not present

## 2020-02-11 DIAGNOSIS — I259 Chronic ischemic heart disease, unspecified: Secondary | ICD-10-CM | POA: Diagnosis not present

## 2020-02-11 DIAGNOSIS — Z6825 Body mass index (BMI) 25.0-25.9, adult: Secondary | ICD-10-CM | POA: Diagnosis not present

## 2020-02-11 DIAGNOSIS — I10 Essential (primary) hypertension: Secondary | ICD-10-CM | POA: Diagnosis not present

## 2020-02-11 DIAGNOSIS — Z125 Encounter for screening for malignant neoplasm of prostate: Secondary | ICD-10-CM | POA: Diagnosis not present

## 2020-02-11 DIAGNOSIS — E7800 Pure hypercholesterolemia, unspecified: Secondary | ICD-10-CM | POA: Diagnosis not present

## 2020-02-11 DIAGNOSIS — Z79899 Other long term (current) drug therapy: Secondary | ICD-10-CM | POA: Diagnosis not present

## 2020-02-15 DIAGNOSIS — M7041 Prepatellar bursitis, right knee: Secondary | ICD-10-CM | POA: Diagnosis not present

## 2020-02-15 DIAGNOSIS — Z6826 Body mass index (BMI) 26.0-26.9, adult: Secondary | ICD-10-CM | POA: Diagnosis not present

## 2020-02-15 DIAGNOSIS — I10 Essential (primary) hypertension: Secondary | ICD-10-CM | POA: Diagnosis not present

## 2020-02-16 DIAGNOSIS — M7041 Prepatellar bursitis, right knee: Secondary | ICD-10-CM | POA: Diagnosis not present

## 2020-02-17 DIAGNOSIS — M7062 Trochanteric bursitis, left hip: Secondary | ICD-10-CM | POA: Diagnosis not present

## 2020-02-23 DIAGNOSIS — E11621 Type 2 diabetes mellitus with foot ulcer: Secondary | ICD-10-CM | POA: Diagnosis not present

## 2020-02-23 DIAGNOSIS — L97412 Non-pressure chronic ulcer of right heel and midfoot with fat layer exposed: Secondary | ICD-10-CM | POA: Diagnosis not present

## 2020-02-23 DIAGNOSIS — E1142 Type 2 diabetes mellitus with diabetic polyneuropathy: Secondary | ICD-10-CM | POA: Diagnosis not present

## 2020-02-23 DIAGNOSIS — M7041 Prepatellar bursitis, right knee: Secondary | ICD-10-CM | POA: Diagnosis not present

## 2020-02-23 DIAGNOSIS — E11610 Type 2 diabetes mellitus with diabetic neuropathic arthropathy: Secondary | ICD-10-CM | POA: Diagnosis not present

## 2020-02-24 DIAGNOSIS — E041 Nontoxic single thyroid nodule: Secondary | ICD-10-CM | POA: Diagnosis not present

## 2020-02-24 DIAGNOSIS — E1165 Type 2 diabetes mellitus with hyperglycemia: Secondary | ICD-10-CM | POA: Diagnosis not present

## 2020-02-24 DIAGNOSIS — E785 Hyperlipidemia, unspecified: Secondary | ICD-10-CM | POA: Diagnosis not present

## 2020-02-24 DIAGNOSIS — E1140 Type 2 diabetes mellitus with diabetic neuropathy, unspecified: Secondary | ICD-10-CM | POA: Diagnosis not present

## 2020-02-24 DIAGNOSIS — Z794 Long term (current) use of insulin: Secondary | ICD-10-CM | POA: Diagnosis not present

## 2020-02-24 DIAGNOSIS — I10 Essential (primary) hypertension: Secondary | ICD-10-CM | POA: Diagnosis not present

## 2020-02-29 DIAGNOSIS — R945 Abnormal results of liver function studies: Secondary | ICD-10-CM | POA: Diagnosis not present

## 2020-03-02 DIAGNOSIS — M71161 Other infective bursitis, right knee: Secondary | ICD-10-CM | POA: Diagnosis not present

## 2020-03-16 DIAGNOSIS — M79671 Pain in right foot: Secondary | ICD-10-CM | POA: Diagnosis not present

## 2020-03-16 DIAGNOSIS — M21079 Valgus deformity, not elsewhere classified, unspecified ankle: Secondary | ICD-10-CM | POA: Diagnosis not present

## 2020-03-27 DIAGNOSIS — L97422 Non-pressure chronic ulcer of left heel and midfoot with fat layer exposed: Secondary | ICD-10-CM | POA: Diagnosis not present

## 2020-03-27 DIAGNOSIS — E1142 Type 2 diabetes mellitus with diabetic polyneuropathy: Secondary | ICD-10-CM | POA: Diagnosis not present

## 2020-03-27 DIAGNOSIS — E11621 Type 2 diabetes mellitus with foot ulcer: Secondary | ICD-10-CM | POA: Diagnosis not present

## 2020-03-27 DIAGNOSIS — Z89412 Acquired absence of left great toe: Secondary | ICD-10-CM | POA: Diagnosis not present

## 2020-03-27 DIAGNOSIS — E11610 Type 2 diabetes mellitus with diabetic neuropathic arthropathy: Secondary | ICD-10-CM | POA: Diagnosis not present

## 2020-04-07 DIAGNOSIS — H40051 Ocular hypertension, right eye: Secondary | ICD-10-CM | POA: Diagnosis not present

## 2020-04-14 DIAGNOSIS — M79671 Pain in right foot: Secondary | ICD-10-CM | POA: Diagnosis not present

## 2020-04-20 ENCOUNTER — Ambulatory Visit: Payer: PPO | Admitting: Cardiology

## 2020-04-20 ENCOUNTER — Encounter: Payer: Self-pay | Admitting: Cardiology

## 2020-04-20 ENCOUNTER — Other Ambulatory Visit: Payer: Self-pay

## 2020-04-20 VITALS — BP 144/72 | HR 66 | Ht 68.0 in | Wt 193.8 lb

## 2020-04-20 DIAGNOSIS — E1165 Type 2 diabetes mellitus with hyperglycemia: Secondary | ICD-10-CM | POA: Diagnosis not present

## 2020-04-20 DIAGNOSIS — Z9889 Other specified postprocedural states: Secondary | ICD-10-CM

## 2020-04-20 DIAGNOSIS — I10 Essential (primary) hypertension: Secondary | ICD-10-CM

## 2020-04-20 DIAGNOSIS — E1140 Type 2 diabetes mellitus with diabetic neuropathy, unspecified: Secondary | ICD-10-CM

## 2020-04-20 DIAGNOSIS — E782 Mixed hyperlipidemia: Secondary | ICD-10-CM | POA: Diagnosis not present

## 2020-04-20 DIAGNOSIS — I340 Nonrheumatic mitral (valve) insufficiency: Secondary | ICD-10-CM | POA: Diagnosis not present

## 2020-04-20 DIAGNOSIS — Z8679 Personal history of other diseases of the circulatory system: Secondary | ICD-10-CM

## 2020-04-20 DIAGNOSIS — I2510 Atherosclerotic heart disease of native coronary artery without angina pectoris: Secondary | ICD-10-CM

## 2020-04-20 DIAGNOSIS — IMO0002 Reserved for concepts with insufficient information to code with codable children: Secondary | ICD-10-CM

## 2020-04-20 NOTE — Patient Instructions (Signed)

## 2020-04-20 NOTE — Progress Notes (Signed)
Cardiology Office Note:    Date:  04/20/2020   ID:  Belvin Gauss, DOB [birth year], MRN [id number]  PCP:  Angelina Sheriff, MD  Cardiologist:  Jenean Lindau, MD   Referring MD: Angelina Sheriff, MD    ASSESSMENT:    1. Coronary artery disease involving native coronary artery of native heart without angina pectoris   2. Benign essential hypertension   3. Mixed hyperlipidemia   4. Moderate mitral regurgitation   5. S/P ablation of atrial fibrillation   6. Type 2 diabetes, uncontrolled, with neuropathy (HCC)    PLAN:    In order of problems listed above:  1. Coronary artery disease: Secondary prevention stressed with the patient.  Importance of compliance with diet medication stressed and she vocalized understanding. 2. Essential hypertension: Blood pressure stable 3. Paroxysmal atrial fibrillation:I discussed with the patient atrial fibrillation, disease process. Management and therapy including rate and rhythm control, anticoagulation benefits and potential risks were discussed extensively with the patient. Patient had multiple questions which were answered to patient's satisfaction. 4. Mixed dyslipidemia diabetes mellitus: PN sheet reviewed and numbers were explained to the patient.  Lipids are fine he needs to diet better for his diabetes mellitus. 5. Patient will be seen in follow-up appointment in 6 months or earlier if the patient has any concerns    Medication Adjustments/Labs and Tests Ordered: Current medicines are reviewed at length with the patient today.  Concerns regarding medicines are outlined above.  No orders of the defined types were placed in this encounter.  No orders of the defined types were placed in this encounter.    No chief complaint on file.    History of Present Illness:    Rafal Archuleta is a 69 y.o. male.  Patient has past medical history of coronary artery disease essential hypertension dyslipidemia diabetes mellitus and paroxysmal atrial  fibrillation.  He denies any problems at this time and takes care of activities of daily living.  No chest pain orthopnea or PND.  At the time of my evaluation, the patient is alert awake oriented and in no distress.  Past Medical History:  Diagnosis Date  . Atrial fibrillation ([place])   . CAD (coronary artery disease)   . Diabetes mellitus ([place])   . Essential hypertension   . Pure hypercholesterolemia     Past Surgical History:  Procedure Laterality Date  . ABLATION OF DYSRHYTHMIC FOCUS    . ATRIAL FIBRILLATION ABLATION N/A 09/24/2018   Procedure: ATRIAL FIBRILLATION ABLATION;  Surgeon: Constance Haw, MD;  Location: [facility];  Service: Cardiovascular;  Laterality: N/A;  . big toe removed    . cardiac catheterization    . CORONARY STENT PLACEMENT    . tonsillectomy      Current Medications: Current Meds  Medication Sig  . gabapentin (NEURONTIN) 300 MG capsule Take 300 mg by mouth at bedtime.   . insulin regular (NOVOLIN R,HUMULIN R) 100 units/mL injection Inject 18-20 Units into the skin 3 (three) times daily before meals.   Marland Kitchen lisinopril (ZESTRIL) 10 MG tablet Take 10 mg by mouth daily.     Allergies:   Patient has no known allergies.   Social History   Socioeconomic History  . Marital status: Unknown    Spouse name: Not on file  . Number of children: Not on file  . Years of education: Not on file  . Highest education level: Not on file  Occupational History  . Not on file  Tobacco Use  . Smoking status: Former Research scientist (life sciences)  . Smokeless tobacco: Never Used  Vaping Use  . Vaping Use: Never used  Substance and Sexual Activity  . Alcohol use: Yes  . Drug use: No  . Sexual activity: Not on file  Other Topics Concern  . Not on file  Social History Narrative  . Not on file   Social Determinants of Health   Financial Resource Strain:   . Difficulty of Paying Living Expenses:   Food Insecurity:   . Worried About Charity fundraiser in the Last Year:   .  Arboriculturist in the Last Year:   Transportation Needs:   . Film/video editor (Medical):   Marland Kitchen Lack of Transportation (Non-Medical):   Physical Activity:   . Days of Exercise per Week:   . Minutes of Exercise per Session:   Stress:   . Feeling of Stress :   Social Connections:   . Frequency of Communication with Friends and Family:   . Frequency of Social Gatherings with Friends and Family:   . Attends Religious Services:   . Active Member of Clubs or Organizations:   . Attends Archivist Meetings:   Marland Kitchen Marital Status:      Family History: The patient's family history includes Cancer in his father; Heart disease in his mother.  ROS:   Please see the history of present illness.    All other systems reviewed and are negative.  EKGs/Labs/Other Studies Reviewed:    The following studies were reviewed today: I discussed my findings with the patient at extensive length   Recent Labs: 05/28/2019: ALT 15; Hemoglobin 14.8; Platelets 154; TSH 3.030 06/11/2019: BUN 24; Creatinine, Ser 1.08; Potassium 5.0; Sodium 140  Recent Lipid Panel    Component Value Date/Time   CHOL 140 05/28/2019 0825   TRIG 89 05/28/2019 0825   HDL 40 05/28/2019 0825   CHOLHDL 3.5 05/28/2019 0825   LDLCALC 82 05/28/2019 0825    Physical Exam:    VS:  BP (!) 144/72   Pulse 66   Ht 5\' 8"  (1.727 m)   Wt 193 lb 12.8 oz (87.9 kg)   SpO2 94%   BMI 29.47 kg/m     Wt Readings from Last 3 Encounters:  04/20/20 193 lb 12.8 oz (87.9 kg)  09/21/19 195 lb (88.5 kg)  09/06/19 195 lb 9.6 oz (88.7 kg)     GEN: Patient is in no acute distress HEENT: Normal NECK: No JVD; No carotid bruits LYMPHATICS: No lymphadenopathy CARDIAC: Hear sounds regular, 2/6 systolic murmur at the apex. RESPIRATORY:  Clear to auscultation without rales, wheezing or rhonchi  ABDOMEN: Soft, non-tender, non-distended MUSCULOSKELETAL:  No edema; No deformity  SKIN: Warm and dry NEUROLOGIC:  Alert and oriented x  3 PSYCHIATRIC:  Normal affect   Signed, Jenean Lindau, MD  04/20/2020 4:09 PM    [facility]

## 2020-05-01 DIAGNOSIS — L97422 Non-pressure chronic ulcer of left heel and midfoot with fat layer exposed: Secondary | ICD-10-CM | POA: Diagnosis not present

## 2020-05-01 DIAGNOSIS — E1165 Type 2 diabetes mellitus with hyperglycemia: Secondary | ICD-10-CM | POA: Diagnosis not present

## 2020-05-01 DIAGNOSIS — Z89412 Acquired absence of left great toe: Secondary | ICD-10-CM | POA: Diagnosis not present

## 2020-05-01 DIAGNOSIS — Z794 Long term (current) use of insulin: Secondary | ICD-10-CM | POA: Diagnosis not present

## 2020-05-01 DIAGNOSIS — E11621 Type 2 diabetes mellitus with foot ulcer: Secondary | ICD-10-CM | POA: Diagnosis not present

## 2020-05-01 DIAGNOSIS — E1140 Type 2 diabetes mellitus with diabetic neuropathy, unspecified: Secondary | ICD-10-CM | POA: Diagnosis not present

## 2020-05-02 ENCOUNTER — Other Ambulatory Visit: Payer: Self-pay | Admitting: Cardiology

## 2020-05-16 DIAGNOSIS — Z20828 Contact with and (suspected) exposure to other viral communicable diseases: Secondary | ICD-10-CM | POA: Diagnosis not present

## 2020-05-16 DIAGNOSIS — J069 Acute upper respiratory infection, unspecified: Secondary | ICD-10-CM | POA: Diagnosis not present

## 2020-05-16 DIAGNOSIS — R509 Fever, unspecified: Secondary | ICD-10-CM | POA: Diagnosis not present

## 2020-05-16 DIAGNOSIS — R05 Cough: Secondary | ICD-10-CM | POA: Diagnosis not present

## 2020-05-16 DIAGNOSIS — R6883 Chills (without fever): Secondary | ICD-10-CM | POA: Diagnosis not present

## 2020-05-17 DIAGNOSIS — E11610 Type 2 diabetes mellitus with diabetic neuropathic arthropathy: Secondary | ICD-10-CM | POA: Diagnosis not present

## 2020-05-17 DIAGNOSIS — E1140 Type 2 diabetes mellitus with diabetic neuropathy, unspecified: Secondary | ICD-10-CM | POA: Diagnosis not present

## 2020-05-17 DIAGNOSIS — I4892 Unspecified atrial flutter: Secondary | ICD-10-CM | POA: Diagnosis not present

## 2020-05-17 DIAGNOSIS — L97524 Non-pressure chronic ulcer of other part of left foot with necrosis of bone: Secondary | ICD-10-CM | POA: Diagnosis not present

## 2020-05-17 DIAGNOSIS — E785 Hyperlipidemia, unspecified: Secondary | ICD-10-CM | POA: Diagnosis not present

## 2020-05-17 DIAGNOSIS — M868X7 Other osteomyelitis, ankle and foot: Secondary | ICD-10-CM | POA: Diagnosis not present

## 2020-05-17 DIAGNOSIS — I771 Stricture of artery: Secondary | ICD-10-CM | POA: Diagnosis not present

## 2020-05-17 DIAGNOSIS — S91109A Unspecified open wound of unspecified toe(s) without damage to nail, initial encounter: Secondary | ICD-10-CM | POA: Diagnosis not present

## 2020-05-17 DIAGNOSIS — R6520 Severe sepsis without septic shock: Secondary | ICD-10-CM | POA: Diagnosis not present

## 2020-05-17 DIAGNOSIS — E1010 Type 1 diabetes mellitus with ketoacidosis without coma: Secondary | ICD-10-CM | POA: Diagnosis not present

## 2020-05-17 DIAGNOSIS — M19072 Primary osteoarthritis, left ankle and foot: Secondary | ICD-10-CM | POA: Diagnosis not present

## 2020-05-17 DIAGNOSIS — R269 Unspecified abnormalities of gait and mobility: Secondary | ICD-10-CM | POA: Diagnosis not present

## 2020-05-17 DIAGNOSIS — Z794 Long term (current) use of insulin: Secondary | ICD-10-CM | POA: Diagnosis not present

## 2020-05-17 DIAGNOSIS — E10621 Type 1 diabetes mellitus with foot ulcer: Secondary | ICD-10-CM | POA: Diagnosis not present

## 2020-05-17 DIAGNOSIS — S91302A Unspecified open wound, left foot, initial encounter: Secondary | ICD-10-CM | POA: Diagnosis not present

## 2020-05-17 DIAGNOSIS — E1110 Type 2 diabetes mellitus with ketoacidosis without coma: Secondary | ICD-10-CM | POA: Diagnosis not present

## 2020-05-17 DIAGNOSIS — E860 Dehydration: Secondary | ICD-10-CM | POA: Diagnosis not present

## 2020-05-17 DIAGNOSIS — E1021 Type 1 diabetes mellitus with diabetic nephropathy: Secondary | ICD-10-CM | POA: Diagnosis not present

## 2020-05-17 DIAGNOSIS — E1043 Type 1 diabetes mellitus with diabetic autonomic (poly)neuropathy: Secondary | ICD-10-CM | POA: Diagnosis not present

## 2020-05-17 DIAGNOSIS — I10 Essential (primary) hypertension: Secondary | ICD-10-CM | POA: Diagnosis not present

## 2020-05-17 DIAGNOSIS — I4891 Unspecified atrial fibrillation: Secondary | ICD-10-CM | POA: Diagnosis not present

## 2020-05-17 DIAGNOSIS — E11621 Type 2 diabetes mellitus with foot ulcer: Secondary | ICD-10-CM | POA: Diagnosis not present

## 2020-05-17 DIAGNOSIS — L03116 Cellulitis of left lower limb: Secondary | ICD-10-CM | POA: Diagnosis not present

## 2020-05-17 DIAGNOSIS — I252 Old myocardial infarction: Secondary | ICD-10-CM | POA: Diagnosis not present

## 2020-05-17 DIAGNOSIS — I482 Chronic atrial fibrillation, unspecified: Secondary | ICD-10-CM | POA: Diagnosis not present

## 2020-05-17 DIAGNOSIS — E041 Nontoxic single thyroid nodule: Secondary | ICD-10-CM | POA: Diagnosis not present

## 2020-05-17 DIAGNOSIS — I2510 Atherosclerotic heart disease of native coronary artery without angina pectoris: Secondary | ICD-10-CM | POA: Diagnosis not present

## 2020-05-17 DIAGNOSIS — E11628 Type 2 diabetes mellitus with other skin complications: Secondary | ICD-10-CM | POA: Diagnosis not present

## 2020-05-17 DIAGNOSIS — Z89412 Acquired absence of left great toe: Secondary | ICD-10-CM | POA: Diagnosis not present

## 2020-05-17 DIAGNOSIS — L089 Local infection of the skin and subcutaneous tissue, unspecified: Secondary | ICD-10-CM | POA: Insufficient documentation

## 2020-05-17 DIAGNOSIS — L97529 Non-pressure chronic ulcer of other part of left foot with unspecified severity: Secondary | ICD-10-CM | POA: Diagnosis not present

## 2020-05-17 DIAGNOSIS — E10628 Type 1 diabetes mellitus with other skin complications: Secondary | ICD-10-CM | POA: Diagnosis not present

## 2020-05-17 DIAGNOSIS — E1142 Type 2 diabetes mellitus with diabetic polyneuropathy: Secondary | ICD-10-CM | POA: Diagnosis not present

## 2020-05-17 DIAGNOSIS — N179 Acute kidney failure, unspecified: Secondary | ICD-10-CM | POA: Diagnosis not present

## 2020-05-17 DIAGNOSIS — E10610 Type 1 diabetes mellitus with diabetic neuropathic arthropathy: Secondary | ICD-10-CM | POA: Diagnosis not present

## 2020-05-17 DIAGNOSIS — E1165 Type 2 diabetes mellitus with hyperglycemia: Secondary | ICD-10-CM | POA: Diagnosis not present

## 2020-05-17 DIAGNOSIS — A419 Sepsis, unspecified organism: Secondary | ICD-10-CM | POA: Diagnosis not present

## 2020-05-17 DIAGNOSIS — E1042 Type 1 diabetes mellitus with diabetic polyneuropathy: Secondary | ICD-10-CM | POA: Diagnosis not present

## 2020-05-17 DIAGNOSIS — K219 Gastro-esophageal reflux disease without esophagitis: Secondary | ICD-10-CM | POA: Diagnosis not present

## 2020-05-17 DIAGNOSIS — J449 Chronic obstructive pulmonary disease, unspecified: Secondary | ICD-10-CM | POA: Diagnosis not present

## 2020-05-17 DIAGNOSIS — E1069 Type 1 diabetes mellitus with other specified complication: Secondary | ICD-10-CM | POA: Diagnosis not present

## 2020-05-17 DIAGNOSIS — E11622 Type 2 diabetes mellitus with other skin ulcer: Secondary | ICD-10-CM | POA: Diagnosis not present

## 2020-05-17 DIAGNOSIS — I340 Nonrheumatic mitral (valve) insufficiency: Secondary | ICD-10-CM | POA: Diagnosis not present

## 2020-05-17 DIAGNOSIS — Z20822 Contact with and (suspected) exposure to covid-19: Secondary | ICD-10-CM | POA: Diagnosis not present

## 2020-05-17 HISTORY — DX: Type 2 diabetes mellitus with other skin complications: E11.628

## 2020-05-17 HISTORY — DX: Local infection of the skin and subcutaneous tissue, unspecified: L08.9

## 2020-05-29 DIAGNOSIS — Z6825 Body mass index (BMI) 25.0-25.9, adult: Secondary | ICD-10-CM | POA: Diagnosis not present

## 2020-05-29 DIAGNOSIS — S98139A Complete traumatic amputation of one unspecified lesser toe, initial encounter: Secondary | ICD-10-CM | POA: Diagnosis not present

## 2020-05-29 DIAGNOSIS — Z0000 Encounter for general adult medical examination without abnormal findings: Secondary | ICD-10-CM | POA: Diagnosis not present

## 2020-05-29 DIAGNOSIS — I4891 Unspecified atrial fibrillation: Secondary | ICD-10-CM | POA: Diagnosis not present

## 2020-05-29 DIAGNOSIS — I259 Chronic ischemic heart disease, unspecified: Secondary | ICD-10-CM | POA: Diagnosis not present

## 2020-05-29 DIAGNOSIS — E7800 Pure hypercholesterolemia, unspecified: Secondary | ICD-10-CM | POA: Diagnosis not present

## 2020-05-29 DIAGNOSIS — I10 Essential (primary) hypertension: Secondary | ICD-10-CM | POA: Diagnosis not present

## 2020-05-29 DIAGNOSIS — R0989 Other specified symptoms and signs involving the circulatory and respiratory systems: Secondary | ICD-10-CM | POA: Diagnosis not present

## 2020-05-29 DIAGNOSIS — Z9119 Patient's noncompliance with other medical treatment and regimen: Secondary | ICD-10-CM | POA: Diagnosis not present

## 2020-05-29 DIAGNOSIS — Z1331 Encounter for screening for depression: Secondary | ICD-10-CM | POA: Diagnosis not present

## 2020-06-01 DIAGNOSIS — E1165 Type 2 diabetes mellitus with hyperglycemia: Secondary | ICD-10-CM | POA: Diagnosis not present

## 2020-06-01 DIAGNOSIS — L97422 Non-pressure chronic ulcer of left heel and midfoot with fat layer exposed: Secondary | ICD-10-CM | POA: Diagnosis not present

## 2020-06-01 DIAGNOSIS — E1140 Type 2 diabetes mellitus with diabetic neuropathy, unspecified: Secondary | ICD-10-CM | POA: Diagnosis not present

## 2020-06-01 DIAGNOSIS — E11610 Type 2 diabetes mellitus with diabetic neuropathic arthropathy: Secondary | ICD-10-CM | POA: Diagnosis not present

## 2020-06-01 DIAGNOSIS — E1142 Type 2 diabetes mellitus with diabetic polyneuropathy: Secondary | ICD-10-CM | POA: Diagnosis not present

## 2020-06-01 DIAGNOSIS — E08621 Diabetes mellitus due to underlying condition with foot ulcer: Secondary | ICD-10-CM | POA: Diagnosis not present

## 2020-06-02 DIAGNOSIS — I6523 Occlusion and stenosis of bilateral carotid arteries: Secondary | ICD-10-CM | POA: Diagnosis not present

## 2020-06-02 DIAGNOSIS — R0989 Other specified symptoms and signs involving the circulatory and respiratory systems: Secondary | ICD-10-CM | POA: Diagnosis not present

## 2020-06-05 DIAGNOSIS — Z4889 Encounter for other specified surgical aftercare: Secondary | ICD-10-CM

## 2020-06-05 HISTORY — DX: Encounter for other specified surgical aftercare: Z48.89

## 2020-06-11 ENCOUNTER — Other Ambulatory Visit: Payer: Self-pay | Admitting: Cardiology

## 2020-06-14 DIAGNOSIS — I10 Essential (primary) hypertension: Secondary | ICD-10-CM | POA: Diagnosis not present

## 2020-06-14 DIAGNOSIS — E1140 Type 2 diabetes mellitus with diabetic neuropathy, unspecified: Secondary | ICD-10-CM | POA: Diagnosis not present

## 2020-06-14 DIAGNOSIS — E7800 Pure hypercholesterolemia, unspecified: Secondary | ICD-10-CM | POA: Diagnosis not present

## 2020-06-14 DIAGNOSIS — E1165 Type 2 diabetes mellitus with hyperglycemia: Secondary | ICD-10-CM | POA: Diagnosis not present

## 2020-06-14 DIAGNOSIS — E041 Nontoxic single thyroid nodule: Secondary | ICD-10-CM | POA: Diagnosis not present

## 2020-06-27 DIAGNOSIS — E041 Nontoxic single thyroid nodule: Secondary | ICD-10-CM | POA: Diagnosis not present

## 2020-07-14 DIAGNOSIS — K529 Noninfective gastroenteritis and colitis, unspecified: Secondary | ICD-10-CM | POA: Diagnosis not present

## 2020-07-14 DIAGNOSIS — Z6824 Body mass index (BMI) 24.0-24.9, adult: Secondary | ICD-10-CM | POA: Diagnosis not present

## 2020-07-14 DIAGNOSIS — I10 Essential (primary) hypertension: Secondary | ICD-10-CM | POA: Diagnosis not present

## 2020-07-15 DIAGNOSIS — K529 Noninfective gastroenteritis and colitis, unspecified: Secondary | ICD-10-CM | POA: Diagnosis not present

## 2020-07-17 DIAGNOSIS — E113393 Type 2 diabetes mellitus with moderate nonproliferative diabetic retinopathy without macular edema, bilateral: Secondary | ICD-10-CM | POA: Diagnosis not present

## 2020-07-25 DIAGNOSIS — L821 Other seborrheic keratosis: Secondary | ICD-10-CM | POA: Diagnosis not present

## 2020-07-25 DIAGNOSIS — D225 Melanocytic nevi of trunk: Secondary | ICD-10-CM | POA: Diagnosis not present

## 2020-07-25 DIAGNOSIS — D485 Neoplasm of uncertain behavior of skin: Secondary | ICD-10-CM | POA: Diagnosis not present

## 2020-08-09 ENCOUNTER — Other Ambulatory Visit: Payer: Self-pay | Admitting: Cardiology

## 2020-08-28 DIAGNOSIS — M2041 Other hammer toe(s) (acquired), right foot: Secondary | ICD-10-CM | POA: Diagnosis not present

## 2020-08-28 DIAGNOSIS — Z8631 Personal history of diabetic foot ulcer: Secondary | ICD-10-CM | POA: Diagnosis not present

## 2020-08-28 DIAGNOSIS — Z89412 Acquired absence of left great toe: Secondary | ICD-10-CM | POA: Diagnosis not present

## 2020-08-28 DIAGNOSIS — E1142 Type 2 diabetes mellitus with diabetic polyneuropathy: Secondary | ICD-10-CM | POA: Diagnosis not present

## 2020-08-28 DIAGNOSIS — M2042 Other hammer toe(s) (acquired), left foot: Secondary | ICD-10-CM | POA: Diagnosis not present

## 2020-08-28 DIAGNOSIS — E11610 Type 2 diabetes mellitus with diabetic neuropathic arthropathy: Secondary | ICD-10-CM | POA: Diagnosis not present

## 2020-09-19 DIAGNOSIS — D485 Neoplasm of uncertain behavior of skin: Secondary | ICD-10-CM | POA: Diagnosis not present

## 2020-09-29 DIAGNOSIS — E7800 Pure hypercholesterolemia, unspecified: Secondary | ICD-10-CM | POA: Diagnosis not present

## 2020-09-29 DIAGNOSIS — I10 Essential (primary) hypertension: Secondary | ICD-10-CM | POA: Diagnosis not present

## 2020-09-29 DIAGNOSIS — E041 Nontoxic single thyroid nodule: Secondary | ICD-10-CM | POA: Diagnosis not present

## 2020-09-29 DIAGNOSIS — E1165 Type 2 diabetes mellitus with hyperglycemia: Secondary | ICD-10-CM | POA: Diagnosis not present

## 2020-09-29 DIAGNOSIS — E1140 Type 2 diabetes mellitus with diabetic neuropathy, unspecified: Secondary | ICD-10-CM | POA: Diagnosis not present

## 2020-10-11 DIAGNOSIS — E1165 Type 2 diabetes mellitus with hyperglycemia: Secondary | ICD-10-CM | POA: Diagnosis not present

## 2020-10-18 ENCOUNTER — Other Ambulatory Visit: Payer: Self-pay

## 2020-10-18 DIAGNOSIS — E119 Type 2 diabetes mellitus without complications: Secondary | ICD-10-CM | POA: Insufficient documentation

## 2020-10-18 DIAGNOSIS — E7800 Pure hypercholesterolemia, unspecified: Secondary | ICD-10-CM | POA: Insufficient documentation

## 2020-10-18 DIAGNOSIS — I4891 Unspecified atrial fibrillation: Secondary | ICD-10-CM | POA: Insufficient documentation

## 2020-10-18 DIAGNOSIS — I2510 Atherosclerotic heart disease of native coronary artery without angina pectoris: Secondary | ICD-10-CM | POA: Insufficient documentation

## 2020-10-18 DIAGNOSIS — I10 Essential (primary) hypertension: Secondary | ICD-10-CM | POA: Insufficient documentation

## 2020-10-20 ENCOUNTER — Encounter: Payer: Self-pay | Admitting: Cardiology

## 2020-10-20 ENCOUNTER — Ambulatory Visit: Payer: PPO | Admitting: Cardiology

## 2020-10-20 ENCOUNTER — Other Ambulatory Visit: Payer: Self-pay

## 2020-10-20 VITALS — BP 142/74 | HR 61 | Ht 70.0 in | Wt 173.4 lb

## 2020-10-20 DIAGNOSIS — Z9889 Other specified postprocedural states: Secondary | ICD-10-CM | POA: Diagnosis not present

## 2020-10-20 DIAGNOSIS — I10 Essential (primary) hypertension: Secondary | ICD-10-CM | POA: Diagnosis not present

## 2020-10-20 DIAGNOSIS — E1165 Type 2 diabetes mellitus with hyperglycemia: Secondary | ICD-10-CM

## 2020-10-20 DIAGNOSIS — I2510 Atherosclerotic heart disease of native coronary artery without angina pectoris: Secondary | ICD-10-CM

## 2020-10-20 DIAGNOSIS — E1140 Type 2 diabetes mellitus with diabetic neuropathy, unspecified: Secondary | ICD-10-CM

## 2020-10-20 DIAGNOSIS — IMO0002 Reserved for concepts with insufficient information to code with codable children: Secondary | ICD-10-CM

## 2020-10-20 DIAGNOSIS — Z8679 Personal history of other diseases of the circulatory system: Secondary | ICD-10-CM | POA: Diagnosis not present

## 2020-10-20 NOTE — Progress Notes (Signed)
Cardiology Office Note: \     Date:  10/20/2020   ID:  Joseph [name], DOB 12-Oct-1951, MRN [id number]  PCP:  Angelina Sheriff, MD  Cardiologist:  Jenean Lindau, MD   Referring MD: Angelina Sheriff, MD    ASSESSMENT:    1. Essential hypertension   2. Coronary artery disease involving native coronary artery of native heart without angina pectoris   3. Type 2 diabetes, uncontrolled, with neuropathy ([place])   4. S/P ablation of atrial fibrillation    PLAN:    In order of problems listed above:  1. Coronary artery disease: Secondary prevention stressed with the patient.  Importance of compliance with diet medication stressed any vocalized understanding.  He was advised to walk at least half an hour a day 5 days a week and he promises to do so. 2. Cerebrovascular disease and atherosclerosis of the carotid arteries: I discussed findings with the patient at length.  He will continue current medications.  I want to be more aggressive with lipid control and will get him back in the next few days for lipid check.  I would prefer to keep his LDL less than 60.  I discussed this with him and he agrees 3. S/p ablation for atrial fibrillation:I discussed with the patient atrial fibrillation, disease process. Management and therapy including rate and rhythm control, anticoagulation benefits and potential risks were discussed extensively with the patient. Patient had multiple questions which were answered to patient's satisfaction. 4. Mixed dyslipidemia diabetes mellitus: Diet emphasized.  Importance of regular exercise stressed he will be back in the next few days for complete blood work as mentioned above.  I will also do a hemoglobin A1c and forward to primary care physician. 5. Patient will be seen in follow-up appointment in 6 months or earlier if the patient has any concerns    Medication Adjustments/Labs and Tests Ordered: Current medicines are reviewed at length with the patient today.   Concerns regarding medicines are outlined above.  Orders Placed This Encounter  Procedures  . Basic Metabolic Panel (BMET)  . CBC w/Diff  . TSH  . Hepatic function panel  . Lipid Profile  . HgB A1c  . EKG 12-Lead   No orders of the defined types were placed in this encounter.    No chief complaint on file.    History of Present Illness:    Joseph [name] is a 70 y.o. male.  Patient has past medical history of coronary artery disease, essential hypertension, dyslipidemia, diabetes mellitus and atrial fibrillation.  Patient recently underwent angiography of the carotid vessels and is found to have 50 to 70% stenosis on the right and less than 50% on the left.  He denies any problems at this time and takes care of activities of daily living.  No history of TIA or stroke.  At the time of my evaluation, the patient is alert awake oriented and in no distress.  Past Medical History:  Diagnosis Date  . Aftercare following surgery 06/05/2020  . Atrial fibrillation ([place])   . Benign essential hypertension 01/09/2016  . CAD (coronary artery disease)   . Coronary artery disease 05/10/2015  . Diabetes mellitus ([place])   . Diabetic Charcot foot ([place]) 01/09/2016  . Diabetic infection of left foot ([place]) 05/17/2020  . Diabetic peripheral neuropathy ([place]) 11/27/2019  . Diabetic ulcer of left midfoot associated with diabetes mellitus due to underlying condition, with fat layer exposed ([place]) 09/10/2017  . Diabetic ulcer of right midfoot  associated with type 2 diabetes mellitus, with fat layer exposed ([place]) 12/10/2019  . Essential hypertension   . Hammertoes of both feet 11/18/2018  . History of partial ray amputation of first toe of left foot ([place]) 01/31/2016  . Hyperlipidemia 01/09/2016  . Infected blister of toe of right foot 01/05/2018  . Insulin long-term use ([place]) 01/09/2016  . Moderate mitral regurgitation 03/25/2018  . 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  . Pre-ulcerative calluses 07/08/2018  . Pure  hypercholesterolemia   . S/P ablation of atrial fibrillation 10/08/2018  . Status post amputation of left great toe ([place]) 01/31/2016  . Thyroid nodule 04/10/2016  . Toenail fungus 11/17/2017  . Type 2 diabetes, uncontrolled, with neuropathy ([place]) 05/01/2016    Past Surgical History:  Procedure Laterality Date  . ABLATION OF DYSRHYTHMIC FOCUS    . ATRIAL FIBRILLATION ABLATION N/A 09/24/2018   Procedure: ATRIAL FIBRILLATION ABLATION;  Surgeon: Constance Haw, MD;  Location: [facility];  Service: Cardiovascular;  Laterality: N/A;  . big toe removed    . cardiac catheterization    . CORONARY STENT PLACEMENT    . tonsillectomy      Current Medications: Current Meds  Medication Sig  . acetaminophen (TYLENOL) 500 MG tablet Take 500 mg by mouth every 4 (four) hours as needed.   Marland Kitchen aspirin EC 81 MG tablet Take 81 mg by mouth daily.  Marland Kitchen atorvastatin (LIPITOR) 40 MG tablet Take 1 tablet by mouth once daily  . Cyanocobalamin 2000 MCG TBCR Take 2,000 mcg by mouth daily.  Marland Kitchen ELIQUIS 5 MG TABS tablet Take 1 tablet by mouth twice daily  . gabapentin (NEURONTIN) 300 MG capsule Take 300 mg by mouth at bedtime.   . Glucagon 3 MG/DOSE POWD Place 1 spray into the nose as needed.  . latanoprost (XALATAN) 0.005 % ophthalmic solution Place 1 drop into the right eye at bedtime.  Marland Kitchen LEVEMIR 100 UNIT/ML injection Inject 5-10 Units into the skin at bedtime.  Marland Kitchen lisinopril (ZESTRIL) 10 MG tablet Take 10 mg by mouth daily.  . metoprolol tartrate (LOPRESSOR) 50 MG tablet Take 50 mg by mouth 2 (two) times daily.  Marland Kitchen NOVOLOG FLEXPEN RELION 100 UNIT/ML FlexPen Inject 5 Units into the skin with breakfast, with lunch, and with evening meal. Based on sliding scale     Allergies:   Patient has no known allergies.   Social History   Socioeconomic History  . Marital status: Unknown    Spouse name: Not on file  . Number of children: Not on file  . Years of education: Not on file  . Highest education level:  Not on file  Occupational History  . Not on file  Tobacco Use  . Smoking status: Former Research scientist (life sciences)  . Smokeless tobacco: Never Used  Vaping Use  . Vaping Use: Never used  Substance and Sexual Activity  . Alcohol use: Yes  . Drug use: No  . Sexual activity: Not on file  Other Topics Concern  . Not on file  Social History Narrative  . Not on file   Social Determinants of Health   Financial Resource Strain: Not on file  Food Insecurity: Not on file  Transportation Needs: Not on file  Physical Activity: Not on file  Stress: Not on file  Social Connections: Not on file     Family History: The patient's family history includes Cancer in his father; Heart disease in his mother.  ROS:   Please see the history of present illness.  All other systems reviewed and are negative.  EKGs/Labs/Other Studies Reviewed:    The following studies were reviewed today: I discussed my findings with the patient at length.  EKG reveals sinus rhythm nonspecific ST-T changes.   Recent Labs: No results found for requested labs within last 8760 hours.  Recent Lipid Panel    Component Value Date/Time   CHOL 140 05/28/2019 0825   TRIG 89 05/28/2019 0825   HDL 40 05/28/2019 0825   CHOLHDL 3.5 05/28/2019 0825   LDLCALC 82 05/28/2019 0825    Physical Exam:    VS:  BP (!) 142/74   Pulse 61   Ht 5\' 10"  (1.778 m)   Wt 173 lb 6.4 oz (78.7 kg)   SpO2 99%   BMI 24.88 kg/m     Wt Readings from Last 3 Encounters:  10/20/20 173 lb 6.4 oz (78.7 kg)  04/20/20 193 lb 12.8 oz (87.9 kg)  09/21/19 195 lb (88.5 kg)     GEN: Patient is in no acute distress HEENT: Normal NECK: No JVD; No carotid bruits LYMPHATICS: No lymphadenopathy CARDIAC: Hear sounds regular, 2/6 systolic murmur at the apex. RESPIRATORY:  Clear to auscultation without rales, wheezing or rhonchi  ABDOMEN: Soft, non-tender, non-distended MUSCULOSKELETAL:  No edema; No deformity  SKIN: Warm and dry NEUROLOGIC:  Alert and  oriented x 3 PSYCHIATRIC:  Normal affect   Signed, Jenean Lindau, MD  10/20/2020 1:26 PM    [facility]

## 2020-10-20 NOTE — Patient Instructions (Addendum)
Medication Instructions:  *If you need a refill on your cardiac medications before your next appointment, please call your pharmacy*  Lab Work: Your physician has recommended that you have lab work with the next week: BMET, CBC, TSH, Hepatic Function Panel, FASTING Lipid Panel, and HgB A1C. If you have labs (blood work) drawn today and your tests are completely normal, you will receive your results only by: Marland Kitchen MyChart Message (if you have MyChart) OR . A paper copy in the mail If you have any lab test that is abnormal or we need to change your treatment, we will call you to review the results.  Follow-Up: At [facility], you and your health needs are our priority.  As part of our continuing mission to provide you with exceptional heart care, we have created designated Provider Care Teams.  These Care Teams include your primary Cardiologist (physician) and Advanced Practice Providers (APPs -  Physician Assistants and Nurse Practitioners) who all work together to provide you with the care you need, when you need it.  We recommend signing up for the patient portal called "MyChart".  Sign up information is provided on this After Visit Summary.  MyChart is used to connect with patients for Virtual Visits (Telemedicine).  Patients are able to view lab/test results, encounter notes, upcoming appointments, etc.  Non-urgent messages can be sent to your provider as well.   To learn more about what you can do with MyChart, go to [email or web address].    Your next appointment:   Your physician recommends that you schedule a follow-up appointment in: [place] with Dr. Geraldo Pitter  The format for your next appointment:   In Person with Jyl Heinz, MD

## 2020-10-23 DIAGNOSIS — E1140 Type 2 diabetes mellitus with diabetic neuropathy, unspecified: Secondary | ICD-10-CM | POA: Diagnosis not present

## 2020-10-23 DIAGNOSIS — E1165 Type 2 diabetes mellitus with hyperglycemia: Secondary | ICD-10-CM | POA: Diagnosis not present

## 2020-10-25 ENCOUNTER — Other Ambulatory Visit: Payer: Self-pay

## 2020-10-25 DIAGNOSIS — E1140 Type 2 diabetes mellitus with diabetic neuropathy, unspecified: Secondary | ICD-10-CM | POA: Diagnosis not present

## 2020-10-25 DIAGNOSIS — I2510 Atherosclerotic heart disease of native coronary artery without angina pectoris: Secondary | ICD-10-CM

## 2020-10-25 DIAGNOSIS — I10 Essential (primary) hypertension: Secondary | ICD-10-CM | POA: Diagnosis not present

## 2020-10-25 DIAGNOSIS — E1165 Type 2 diabetes mellitus with hyperglycemia: Secondary | ICD-10-CM | POA: Diagnosis not present

## 2020-10-25 DIAGNOSIS — IMO0002 Reserved for concepts with insufficient information to code with codable children: Secondary | ICD-10-CM

## 2020-10-26 LAB — CBC WITH DIFFERENTIAL/PLATELET
Basophils Absolute: 0 10*3/uL (ref 0.0–0.2)
Basos: 1 %
EOS (ABSOLUTE): 0.2 10*3/uL (ref 0.0–0.4)
Eos: 3 %
Hematocrit: 41.9 % (ref 37.5–51.0)
Hemoglobin: 14.1 g/dL (ref 13.0–17.7)
Immature Grans (Abs): 0 10*3/uL (ref 0.0–0.1)
Immature Granulocytes: 0 %
Lymphocytes Absolute: 1.4 10*3/uL (ref 0.7–3.1)
Lymphs: 25 %
MCH: 30.7 pg (ref 26.6–33.0)
MCHC: 33.7 g/dL (ref 31.5–35.7)
MCV: 91 fL (ref 79–97)
Monocytes Absolute: 0.6 10*3/uL (ref 0.1–0.9)
Monocytes: 11 %
Neutrophils Absolute: 3.4 10*3/uL (ref 1.4–7.0)
Neutrophils: 60 %
Platelets: 144 10*3/uL — ABNORMAL LOW (ref 150–450)
RBC: 4.59 x10E6/uL (ref 4.14–5.80)
RDW: 12.1 % (ref 11.6–15.4)
WBC: 5.6 10*3/uL (ref 3.4–10.8)

## 2020-10-26 LAB — BASIC METABOLIC PANEL
BUN/Creatinine Ratio: 19 (ref 10–24)
BUN: 21 mg/dL (ref 8–27)
CO2: 23 mmol/L (ref 20–29)
Calcium: 8.8 mg/dL (ref 8.6–10.2)
Chloride: 104 mmol/L (ref 96–106)
Creatinine, Ser: 1.13 mg/dL (ref 0.76–1.27)
GFR calc Af Amer: 76 mL/min/{1.73_m2} (ref >59)
GFR calc non Af Amer: 66 mL/min/{1.73_m2} (ref >59)
Glucose: 219 mg/dL — ABNORMAL HIGH (ref 65–99)
Potassium: 4.5 mmol/L (ref 3.5–5.2)
Sodium: 140 mmol/L (ref 134–144)

## 2020-10-26 LAB — TSH: TSH: 3.64 u[IU]/mL (ref 0.450–4.500)

## 2020-10-26 LAB — LIPID PANEL
Chol/HDL Ratio: 3.2 ratio (ref 0.0–5.0)
Cholesterol, Total: 155 mg/dL (ref 100–199)
HDL: 49 mg/dL (ref >39)
LDL Chol Calc (NIH): 82 mg/dL (ref 0–99)
Triglycerides: 135 mg/dL (ref 0–149)
VLDL Cholesterol Cal: 24 mg/dL (ref 5–40)

## 2020-10-26 LAB — HEPATIC FUNCTION PANEL
ALT: 9 IU/L (ref 0–44)
AST: 14 IU/L (ref 0–40)
Albumin: 4 g/dL (ref 3.8–4.8)
Alkaline Phosphatase: 133 IU/L — ABNORMAL HIGH (ref 44–121)
Bilirubin Total: 0.7 mg/dL (ref 0.0–1.2)
Bilirubin, Direct: 0.19 mg/dL (ref 0.00–0.40)
Total Protein: 6.2 g/dL (ref 6.0–8.5)

## 2020-10-26 LAB — HEMOGLOBIN A1C
Est. average glucose Bld gHb Est-mCnc: 217 mg/dL
Hgb A1c MFr Bld: 9.2 % — ABNORMAL HIGH (ref 4.8–5.6)

## 2020-11-02 DIAGNOSIS — I4891 Unspecified atrial fibrillation: Secondary | ICD-10-CM | POA: Diagnosis not present

## 2020-11-02 DIAGNOSIS — Z6824 Body mass index (BMI) 24.0-24.9, adult: Secondary | ICD-10-CM | POA: Diagnosis not present

## 2020-11-02 DIAGNOSIS — I259 Chronic ischemic heart disease, unspecified: Secondary | ICD-10-CM | POA: Diagnosis not present

## 2020-11-02 DIAGNOSIS — I10 Essential (primary) hypertension: Secondary | ICD-10-CM | POA: Diagnosis not present

## 2020-11-06 DIAGNOSIS — E1165 Type 2 diabetes mellitus with hyperglycemia: Secondary | ICD-10-CM | POA: Diagnosis not present

## 2020-11-06 DIAGNOSIS — E1140 Type 2 diabetes mellitus with diabetic neuropathy, unspecified: Secondary | ICD-10-CM | POA: Diagnosis not present

## 2020-11-10 ENCOUNTER — Other Ambulatory Visit: Payer: Self-pay | Admitting: Cardiology

## 2020-11-10 NOTE — Telephone Encounter (Signed)
Rx refill sent to pharmacy. 

## 2020-11-11 DIAGNOSIS — E1165 Type 2 diabetes mellitus with hyperglycemia: Secondary | ICD-10-CM | POA: Diagnosis not present

## 2020-11-20 DIAGNOSIS — E113311 Type 2 diabetes mellitus with moderate nonproliferative diabetic retinopathy with macular edema, right eye: Secondary | ICD-10-CM | POA: Diagnosis not present

## 2020-11-20 IMAGING — CT CT CHEST HIGH RESOLUTION W/O CM
2 of 5 series · 15 of 36 positions shown, 18 images · non-contrast
Comparison: None.

CLINICAL DATA: Interstitial lung disease. Chronic shortness of
breath.

EXAM:
CT CHEST WITHOUT CONTRAST
TECHNIQUE: Multidetector CT imaging of the chest was performed following the
standard protocol without intravenous contrast. High resolution
imaging of the lungs, as well as inspiratory and expiratory imaging,
was performed.

[Series 4: high resolution · axial · 0.71mm/px · z∈[+1217,+1537]mm · 12 of 176 slices shown, 15 images]
[im 8/176  mediastinal]
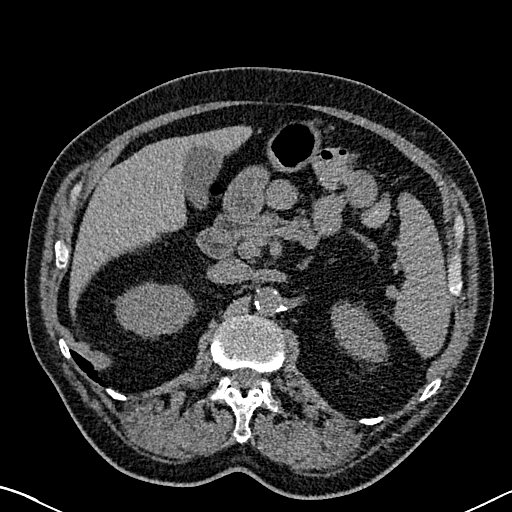
[im 8/176  lung]
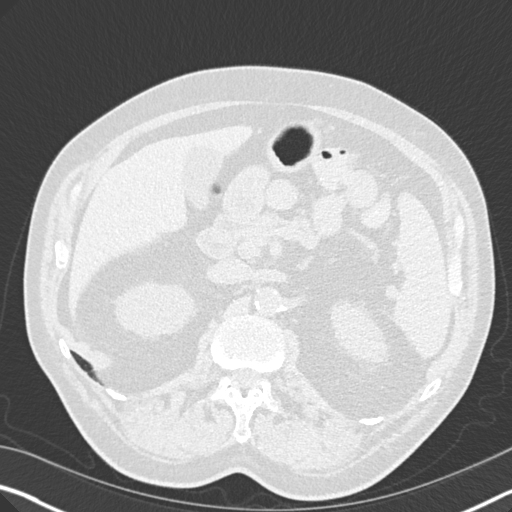
[im 23/176  lung]
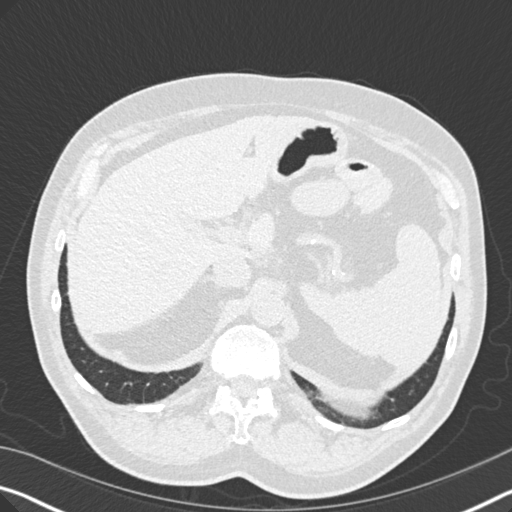
[im 39/176  lung]
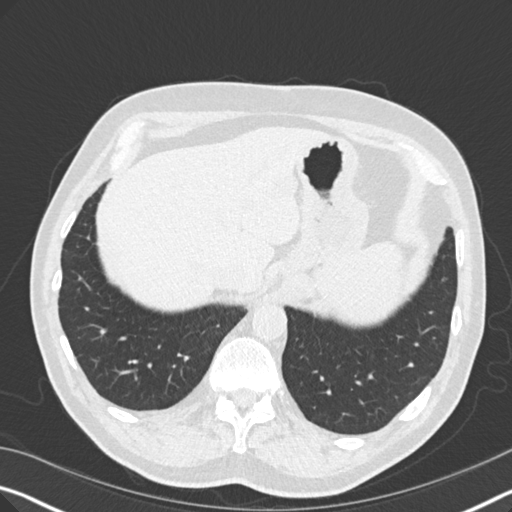
[im 54/176  lung]
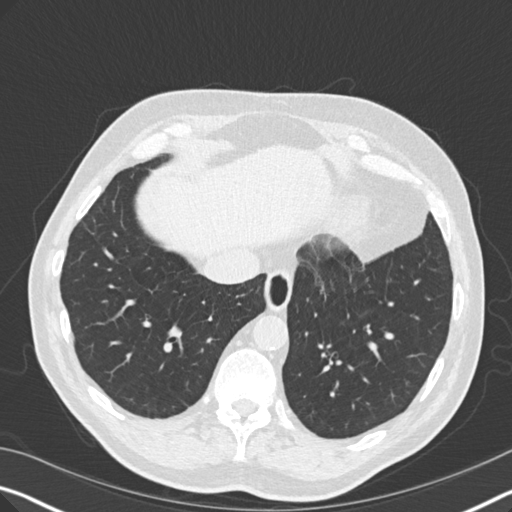
[im 69/176  mediastinal]
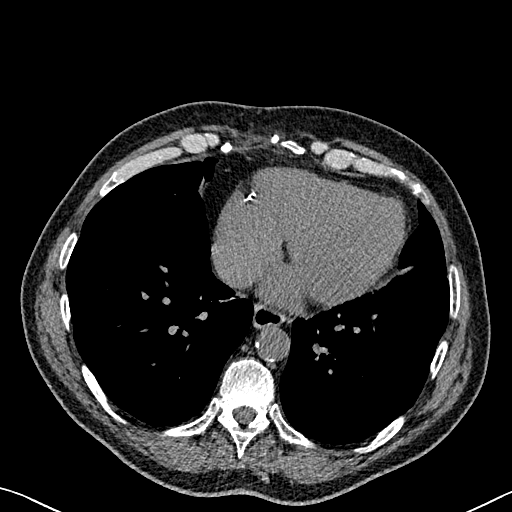
[im 69/176  lung]
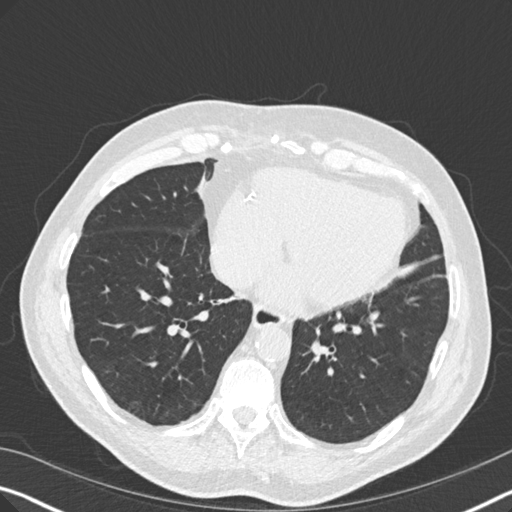
[im 84/176  lung]
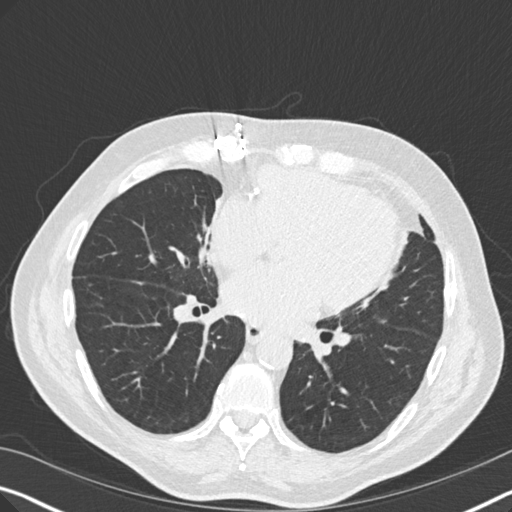
[im 92/176  lung]
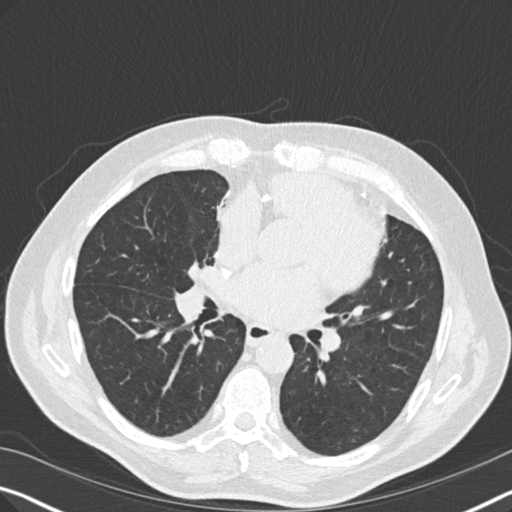
[im 107/176  lung]
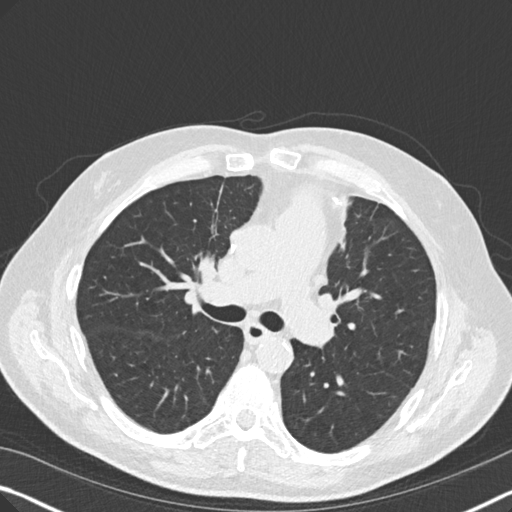
[im 122/176  mediastinal]
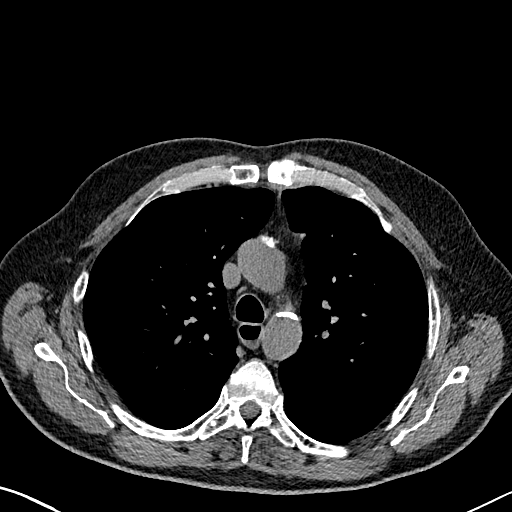
[im 122/176  lung]
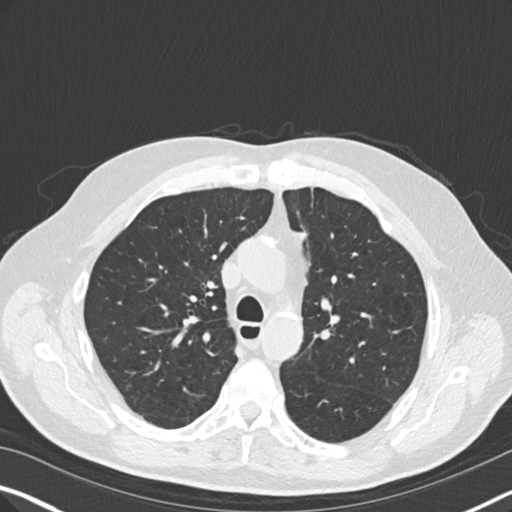
[im 137/176  lung]
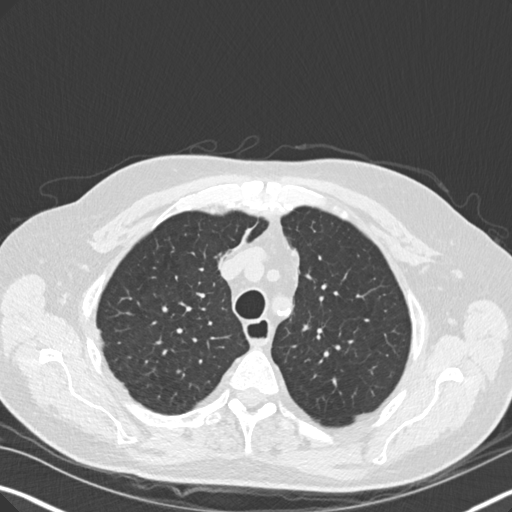
[im 153/176  lung]
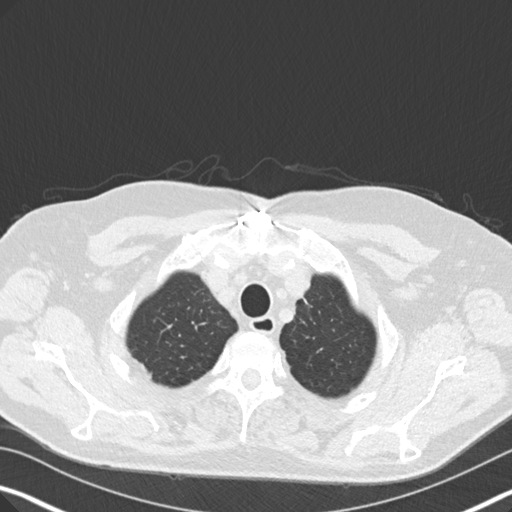
[im 168/176  lung]
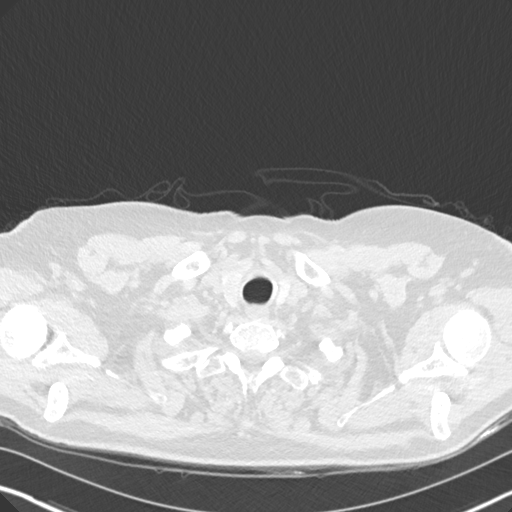

[Series 7: coronal · coronal · 0.70mm/px · 3 of 150 slices shown]
[im 30/150  lung]
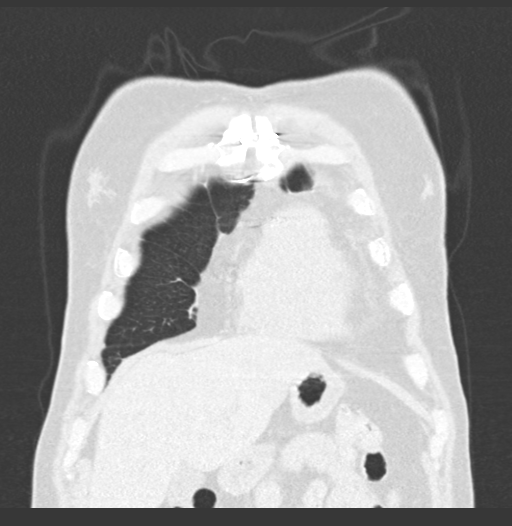
[im 60/150  lung]
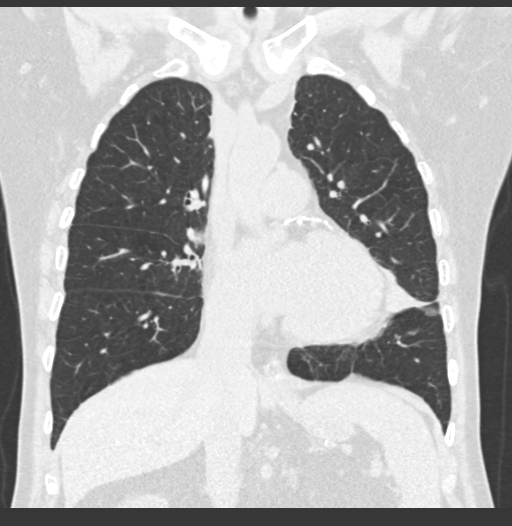
[im 90/150  lung]
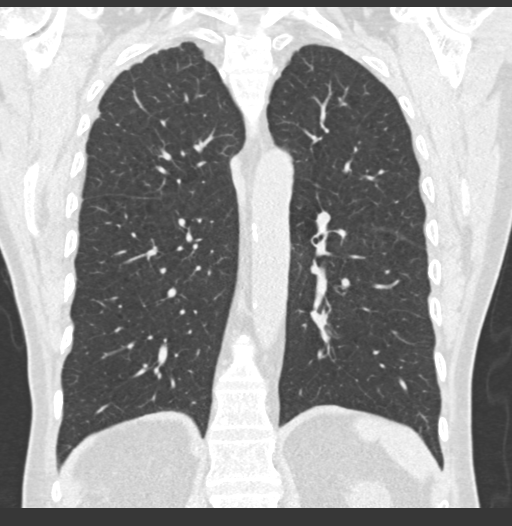

[15 of 36 positions shown; findings below may reference images not displayed]

FINDINGS: Cardiovascular: Atherosclerotic calcification of the aorta. Pulmonic
trunk is enlarged. Heart is at the upper limits of normal in size to
mildly enlarged. No pericardial effusion.

Mediastinum/Nodes: 1.7 cm low-attenuation lesion in the right lobe
of the thyroid, nonspecific. Mediastinal lymph nodes are not
enlarged by CT size criteria. Hilar regions are difficult to
characterize without IV contrast. No axillary adenopathy. Esophagus
is grossly unremarkable.

Lungs/Pleura: Centrilobular emphysema. Scattered pulmonary
parenchymal scarring, worst in the right middle lobe and lingula.
Negative for subpleural reticulation, traction
bronchiectasis/bronchiolectasis, ground-glass, architectural
distortion or honeycombing. No pleural fluid. Airway is
unremarkable. No air trapping.

Upper Abdomen: Subcentimeter low-attenuation lesions in the liver
are too small to characterize but are likely cysts or hamartomas in
the absence of known malignancy. A stone is seen in the gallbladder.
Adrenal glands are unremarkable. Tiny stones in the kidneys.
Visualized portions of the spleen, pancreas, stomach and bowel are
grossly unremarkable.

Musculoskeletal: No worrisome lytic or sclerotic lesions.
IMPRESSION: 1. No evidence of interstitial lung disease.
2.  Emphysema (KIKVP-OVY.6).
3. Cholelithiasis.
4. Bilateral renal stones.
5. Right thyroid nodule. Consider further evaluation with thyroid
ultrasound. If patient is clinically hyperthyroid, consider nuclear
medicine thyroid uptake and scan.
6.  Aortic atherosclerosis (KIKVP-170.0).
7. Enlarged pulmonic trunk, indicative of pulmonary arterial
hypertension.

## 2020-11-21 DIAGNOSIS — E1140 Type 2 diabetes mellitus with diabetic neuropathy, unspecified: Secondary | ICD-10-CM | POA: Diagnosis not present

## 2020-11-21 DIAGNOSIS — E1165 Type 2 diabetes mellitus with hyperglycemia: Secondary | ICD-10-CM | POA: Diagnosis not present

## 2020-11-27 DIAGNOSIS — E1140 Type 2 diabetes mellitus with diabetic neuropathy, unspecified: Secondary | ICD-10-CM | POA: Diagnosis not present

## 2020-11-27 DIAGNOSIS — M14672 Charcot's joint, left ankle and foot: Secondary | ICD-10-CM | POA: Diagnosis not present

## 2020-11-27 DIAGNOSIS — B351 Tinea unguium: Secondary | ICD-10-CM | POA: Diagnosis not present

## 2020-11-27 DIAGNOSIS — Z89412 Acquired absence of left great toe: Secondary | ICD-10-CM | POA: Diagnosis not present

## 2020-11-27 DIAGNOSIS — L603 Nail dystrophy: Secondary | ICD-10-CM | POA: Diagnosis not present

## 2020-11-27 DIAGNOSIS — E1165 Type 2 diabetes mellitus with hyperglycemia: Secondary | ICD-10-CM | POA: Diagnosis not present

## 2020-11-30 DIAGNOSIS — E113313 Type 2 diabetes mellitus with moderate nonproliferative diabetic retinopathy with macular edema, bilateral: Secondary | ICD-10-CM | POA: Diagnosis not present

## 2020-12-11 DIAGNOSIS — E1165 Type 2 diabetes mellitus with hyperglycemia: Secondary | ICD-10-CM | POA: Diagnosis not present

## 2020-12-14 ENCOUNTER — Other Ambulatory Visit: Payer: Self-pay | Admitting: Cardiology

## 2021-01-08 DIAGNOSIS — E1165 Type 2 diabetes mellitus with hyperglycemia: Secondary | ICD-10-CM | POA: Diagnosis not present

## 2021-01-10 DIAGNOSIS — Z794 Long term (current) use of insulin: Secondary | ICD-10-CM | POA: Diagnosis not present

## 2021-01-10 DIAGNOSIS — E041 Nontoxic single thyroid nodule: Secondary | ICD-10-CM | POA: Diagnosis not present

## 2021-01-10 DIAGNOSIS — E7800 Pure hypercholesterolemia, unspecified: Secondary | ICD-10-CM | POA: Diagnosis not present

## 2021-01-10 DIAGNOSIS — E1140 Type 2 diabetes mellitus with diabetic neuropathy, unspecified: Secondary | ICD-10-CM | POA: Diagnosis not present

## 2021-01-10 DIAGNOSIS — E1165 Type 2 diabetes mellitus with hyperglycemia: Secondary | ICD-10-CM | POA: Diagnosis not present

## 2021-01-10 DIAGNOSIS — I10 Essential (primary) hypertension: Secondary | ICD-10-CM | POA: Diagnosis not present

## 2021-01-12 ENCOUNTER — Other Ambulatory Visit: Payer: Self-pay | Admitting: Cardiology

## 2021-01-12 NOTE — Telephone Encounter (Signed)
Prescription refill request for Eliquis received. Indication: atrial fib Last office visit: 1/22 revankar Scr:1.1 1/22 Age: 70 Weight:78.7 kg  Prescription refilled

## 2021-02-08 DIAGNOSIS — E1165 Type 2 diabetes mellitus with hyperglycemia: Secondary | ICD-10-CM | POA: Diagnosis not present

## 2021-02-14 ENCOUNTER — Other Ambulatory Visit: Payer: Self-pay | Admitting: Cardiology

## 2021-02-26 DIAGNOSIS — L603 Nail dystrophy: Secondary | ICD-10-CM | POA: Diagnosis not present

## 2021-02-26 DIAGNOSIS — Z89412 Acquired absence of left great toe: Secondary | ICD-10-CM | POA: Diagnosis not present

## 2021-02-26 DIAGNOSIS — B351 Tinea unguium: Secondary | ICD-10-CM | POA: Diagnosis not present

## 2021-02-26 DIAGNOSIS — E1142 Type 2 diabetes mellitus with diabetic polyneuropathy: Secondary | ICD-10-CM | POA: Diagnosis not present

## 2021-02-26 DIAGNOSIS — E11610 Type 2 diabetes mellitus with diabetic neuropathic arthropathy: Secondary | ICD-10-CM | POA: Diagnosis not present

## 2021-03-10 DIAGNOSIS — E1165 Type 2 diabetes mellitus with hyperglycemia: Secondary | ICD-10-CM | POA: Diagnosis not present

## 2021-04-10 DIAGNOSIS — E1165 Type 2 diabetes mellitus with hyperglycemia: Secondary | ICD-10-CM | POA: Diagnosis not present

## 2021-04-19 ENCOUNTER — Ambulatory Visit: Payer: PPO | Admitting: Cardiology

## 2021-04-19 ENCOUNTER — Encounter: Payer: Self-pay | Admitting: Cardiology

## 2021-04-19 ENCOUNTER — Other Ambulatory Visit: Payer: Self-pay

## 2021-04-19 VITALS — BP 144/66 | HR 58 | Resp 16 | Ht 70.0 in | Wt 181.0 lb

## 2021-04-19 DIAGNOSIS — Z8679 Personal history of other diseases of the circulatory system: Secondary | ICD-10-CM

## 2021-04-19 DIAGNOSIS — Z9889 Other specified postprocedural states: Secondary | ICD-10-CM

## 2021-04-19 DIAGNOSIS — I10 Essential (primary) hypertension: Secondary | ICD-10-CM | POA: Diagnosis not present

## 2021-04-19 DIAGNOSIS — IMO0002 Reserved for concepts with insufficient information to code with codable children: Secondary | ICD-10-CM

## 2021-04-19 DIAGNOSIS — E1165 Type 2 diabetes mellitus with hyperglycemia: Secondary | ICD-10-CM | POA: Diagnosis not present

## 2021-04-19 DIAGNOSIS — E1140 Type 2 diabetes mellitus with diabetic neuropathy, unspecified: Secondary | ICD-10-CM

## 2021-04-19 DIAGNOSIS — I2510 Atherosclerotic heart disease of native coronary artery without angina pectoris: Secondary | ICD-10-CM | POA: Diagnosis not present

## 2021-04-19 DIAGNOSIS — E782 Mixed hyperlipidemia: Secondary | ICD-10-CM

## 2021-04-19 MED ORDER — APIXABAN 5 MG PO TABS: 5.0000 mg | ORAL_TABLET | Freq: Two times a day (BID) | ORAL | 3 refills | Status: DC

## 2021-04-19 MED ORDER — LISINOPRIL 10 MG PO TABS: 10.0000 mg | ORAL_TABLET | Freq: Every day | ORAL | 3 refills | Status: DC

## 2021-04-19 MED ORDER — METOPROLOL TARTRATE 50 MG PO TABS: 50.0000 mg | ORAL_TABLET | Freq: Two times a day (BID) | ORAL | 3 refills | Status: DC

## 2021-04-19 MED ORDER — ATORVASTATIN CALCIUM 40 MG PO TABS: 40.0000 mg | ORAL_TABLET | Freq: Every day | ORAL | 3 refills | Status: DC

## 2021-04-19 NOTE — Progress Notes (Signed)
Cardiology Office Note:    Date:  04/19/2021   ID:  Joseph [name], DOB 13-Sep-1951, MRN [id number]  PCP:  Angelina Sheriff, MD  Cardiologist:  Jenean Lindau, MD   Referring MD: Angelina Sheriff, MD    ASSESSMENT:    1. Coronary artery disease involving native coronary artery of native heart without angina pectoris   2. Essential hypertension   3. Mixed hyperlipidemia   4. Type 2 diabetes, uncontrolled, with neuropathy ([place])   5. S/P ablation of atrial fibrillation    PLAN:    In order of problems listed above:  Coronary artery disease: Secondary prevention stressed with the patient.  Importance of compliance with diet medication stressed any vocalized understanding.  I told him to exercise at least half an hour a day 5 days a week and he promises to do so. Essential hypertension: Blood pressure stable and diet was emphasized.  Lifestyle modification urged.  Salt intake issues were discussed.  His blood pressures at home are better. Mixed dyslipidemia: Lipids were reviewed.  Diet emphasized. Diabetes mellitus: Managed by primary care.  He tells me that his last hemoglobin A1c was 6.1 and he is happy about it. Patient will be seen in follow-up appointment in 6 months or earlier if the patient has any concerns    Medication Adjustments/Labs and Tests Ordered: Current medicines are reviewed at length with the patient today.  Concerns regarding medicines are outlined above.  Orders Placed This Encounter  Procedures   EKG 12-Lead   No orders of the defined types were placed in this encounter.    Chief Complaint  Patient presents with   Follow-up    No complaints.     History of Present Illness:    Joseph [name] is a 70 y.o. male.  Patient has past medical history of coronary artery disease, essential hypertension, dyslipidemia, diabetes mellitus and paroxysmal atrial fibrillation post ablation.  He denies any problems at this time and takes care of activities of daily  living.  No chest pain orthopnea or PND.  He does not exercise on a regular basis.  At the time of my evaluation, the patient is alert awake oriented and in no distress.  Past Medical History:  Diagnosis Date   Aftercare following surgery 06/05/2020   Atrial fibrillation ([facility])    Benign essential hypertension 01/09/2016   CAD (coronary artery disease)    Coronary artery disease 05/10/2015   Diabetes mellitus ([place])    Diabetic Charcot foot ([place]) 01/09/2016   Diabetic infection of left foot ([place]) 05/17/2020   Diabetic peripheral neuropathy ([place]) 11/27/2019   Diabetic ulcer of left midfoot associated with diabetes mellitus due to underlying condition, with fat layer exposed ([place]) 09/10/2017   Diabetic ulcer of right midfoot associated with type 2 diabetes mellitus, with fat layer exposed ([place]) 12/10/2019   Essential hypertension    Hammertoes of both feet 11/18/2018   History of partial ray amputation of first toe of left foot ([place]) 01/31/2016   Hyperlipidemia 01/09/2016   Infected blister of toe of right foot 01/05/2018   Insulin long-term use ([place]) 01/09/2016   Moderate mitral regurgitation 03/25/2018   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   Pre-ulcerative calluses 07/08/2018   Pure hypercholesterolemia    S/P ablation of atrial fibrillation 10/08/2018   Status post amputation of left great toe ([place]) 01/31/2016   Thyroid nodule 04/10/2016   Toenail fungus 11/17/2017   Type 2 diabetes, uncontrolled, with neuropathy ([place]) 05/01/2016    Past Surgical  History:  Procedure Laterality Date   ABLATION OF DYSRHYTHMIC FOCUS     ATRIAL FIBRILLATION ABLATION N/A 09/24/2018   Procedure: ATRIAL FIBRILLATION ABLATION;  Surgeon: Constance Haw, MD;  Location: [facility];  Service: Cardiovascular;  Laterality: N/A;   big toe removed     cardiac catheterization     CORONARY STENT PLACEMENT     tonsillectomy      Current Medications: Current Meds  Medication Sig   acetaminophen (TYLENOL) 500 MG tablet Take 500  mg by mouth every 4 (four) hours as needed.    aspirin EC 81 MG tablet Take 81 mg by mouth daily.   atorvastatin (LIPITOR) 40 MG tablet Take 1 tablet by mouth once daily   Cyanocobalamin 2000 MCG TBCR Take 2,000 mcg by mouth daily.   ELIQUIS 5 MG TABS tablet Take 1 tablet by mouth twice daily   gabapentin (NEURONTIN) 300 MG capsule Take 300 mg by mouth at bedtime.    Glucagon 3 MG/DOSE POWD Place 1 spray into the nose as needed.   latanoprost (XALATAN) 0.005 % ophthalmic solution Place 1 drop into the right eye at bedtime.   LEVEMIR 100 UNIT/ML injection Inject 5-10 Units into the skin at bedtime.   lisinopril (ZESTRIL) 10 MG tablet Take 10 mg by mouth daily.   metoprolol tartrate (LOPRESSOR) 50 MG tablet Take 50 mg by mouth 2 (two) times daily.   NOVOLOG FLEXPEN RELION 100 UNIT/ML FlexPen Inject 5 Units into the skin with breakfast, with lunch, and with evening meal. Based on sliding scale     Allergies:   Patient has no known allergies.   Social History   Socioeconomic History   Marital status: Unknown    Spouse name: Not on file   Number of children: Not on file   Years of education: Not on file   Highest education level: Not on file  Occupational History   Not on file  Tobacco Use   Smoking status: Former    Pack years: 0.00   Smokeless tobacco: Never  Vaping Use   Vaping Use: Never used  Substance and Sexual Activity   Alcohol use: Yes   Drug use: No   Sexual activity: Not on file  Other Topics Concern   Not on file  Social History Narrative   Not on file   Social Determinants of Health   Financial Resource Strain: Not on file  Food Insecurity: Not on file  Transportation Needs: Not on file  Physical Activity: Not on file  Stress: Not on file  Social Connections: Not on file     Family History: The patient's family history includes Cancer in his father; Heart disease in his mother.  ROS:   Please see the history of present illness.    All other systems  reviewed and are negative.  EKGs/Labs/Other Studies Reviewed:    The following studies were reviewed today: EKG reveals sinus rhythm and nonspecific ST-T changes   Recent Labs: 10/25/2020: ALT 9; BUN 21; Creatinine, Ser 1.13; Hemoglobin 14.1; Platelets 144; Potassium 4.5; Sodium 140; TSH 3.640  Recent Lipid Panel    Component Value Date/Time   CHOL 155 10/25/2020 0847   TRIG 135 10/25/2020 0847   HDL 49 10/25/2020 0847   CHOLHDL 3.2 10/25/2020 0847   LDLCALC 82 10/25/2020 0847    Physical Exam:    VS:  BP (!) 144/66 (BP Location: Left Arm, Patient Position: Sitting, Cuff Size: Normal)   Pulse (!) 58   Resp  16   Ht 5\' 10"  (1.778 m)   Wt 181 lb (82.1 kg)   SpO2 98%   BMI 25.97 kg/m     Wt Readings from Last 3 Encounters:  04/19/21 181 lb (82.1 kg)  10/20/20 173 lb 6.4 oz (78.7 kg)  04/20/20 193 lb 12.8 oz (87.9 kg)     GEN: Patient is in no acute distress HEENT: Normal NECK: No JVD; No carotid bruits LYMPHATICS: No lymphadenopathy CARDIAC: Hear sounds regular, 2/6 systolic murmur at the apex. RESPIRATORY:  Clear to auscultation without rales, wheezing or rhonchi  ABDOMEN: Soft, non-tender, non-distended MUSCULOSKELETAL:  No edema; No deformity  SKIN: Warm and dry NEUROLOGIC:  Alert and oriented x 3 PSYCHIATRIC:  Normal affect   Signed, Jenean Lindau, MD  04/19/2021 1:12 PM    [facility]

## 2021-04-19 NOTE — Patient Instructions (Signed)

## 2021-04-19 NOTE — Addendum Note (Signed)
Addended by: Truddie Hidden on: 04/19/2021 01:23 PM   Modules accepted: Orders

## 2021-04-20 DIAGNOSIS — E1142 Type 2 diabetes mellitus with diabetic polyneuropathy: Secondary | ICD-10-CM | POA: Diagnosis not present

## 2021-04-20 DIAGNOSIS — I10 Essential (primary) hypertension: Secondary | ICD-10-CM | POA: Diagnosis not present

## 2021-04-20 DIAGNOSIS — Z794 Long term (current) use of insulin: Secondary | ICD-10-CM | POA: Diagnosis not present

## 2021-04-20 DIAGNOSIS — E041 Nontoxic single thyroid nodule: Secondary | ICD-10-CM | POA: Diagnosis not present

## 2021-04-20 DIAGNOSIS — E7800 Pure hypercholesterolemia, unspecified: Secondary | ICD-10-CM | POA: Diagnosis not present

## 2021-04-23 DIAGNOSIS — D2239 Melanocytic nevi of other parts of face: Secondary | ICD-10-CM | POA: Diagnosis not present

## 2021-04-23 DIAGNOSIS — D225 Melanocytic nevi of trunk: Secondary | ICD-10-CM | POA: Diagnosis not present

## 2021-04-23 DIAGNOSIS — D1801 Hemangioma of skin and subcutaneous tissue: Secondary | ICD-10-CM | POA: Diagnosis not present

## 2021-04-23 DIAGNOSIS — L821 Other seborrheic keratosis: Secondary | ICD-10-CM | POA: Diagnosis not present

## 2021-05-10 DIAGNOSIS — E1165 Type 2 diabetes mellitus with hyperglycemia: Secondary | ICD-10-CM | POA: Diagnosis not present

## 2021-05-28 DIAGNOSIS — Z89412 Acquired absence of left great toe: Secondary | ICD-10-CM | POA: Diagnosis not present

## 2021-05-28 DIAGNOSIS — B351 Tinea unguium: Secondary | ICD-10-CM | POA: Diagnosis not present

## 2021-05-28 DIAGNOSIS — E1142 Type 2 diabetes mellitus with diabetic polyneuropathy: Secondary | ICD-10-CM | POA: Diagnosis not present

## 2021-05-28 DIAGNOSIS — E11610 Type 2 diabetes mellitus with diabetic neuropathic arthropathy: Secondary | ICD-10-CM | POA: Diagnosis not present

## 2021-05-28 DIAGNOSIS — L603 Nail dystrophy: Secondary | ICD-10-CM | POA: Diagnosis not present

## 2021-05-31 DIAGNOSIS — H25811 Combined forms of age-related cataract, right eye: Secondary | ICD-10-CM | POA: Diagnosis not present

## 2021-05-31 DIAGNOSIS — E113313 Type 2 diabetes mellitus with moderate nonproliferative diabetic retinopathy with macular edema, bilateral: Secondary | ICD-10-CM | POA: Diagnosis not present

## 2021-06-10 DIAGNOSIS — E1165 Type 2 diabetes mellitus with hyperglycemia: Secondary | ICD-10-CM | POA: Diagnosis not present

## 2021-07-11 DIAGNOSIS — E1165 Type 2 diabetes mellitus with hyperglycemia: Secondary | ICD-10-CM | POA: Diagnosis not present

## 2021-07-27 DIAGNOSIS — E041 Nontoxic single thyroid nodule: Secondary | ICD-10-CM | POA: Diagnosis not present

## 2021-07-27 DIAGNOSIS — I10 Essential (primary) hypertension: Secondary | ICD-10-CM | POA: Diagnosis not present

## 2021-07-27 DIAGNOSIS — Z794 Long term (current) use of insulin: Secondary | ICD-10-CM | POA: Diagnosis not present

## 2021-07-27 DIAGNOSIS — E785 Hyperlipidemia, unspecified: Secondary | ICD-10-CM | POA: Diagnosis not present

## 2021-07-27 DIAGNOSIS — E1140 Type 2 diabetes mellitus with diabetic neuropathy, unspecified: Secondary | ICD-10-CM | POA: Diagnosis not present

## 2021-08-10 DIAGNOSIS — E1165 Type 2 diabetes mellitus with hyperglycemia: Secondary | ICD-10-CM | POA: Diagnosis not present

## 2021-08-14 DIAGNOSIS — H40033 Anatomical narrow angle, bilateral: Secondary | ICD-10-CM | POA: Diagnosis not present

## 2021-08-14 DIAGNOSIS — Z01818 Encounter for other preprocedural examination: Secondary | ICD-10-CM | POA: Diagnosis not present

## 2021-08-14 DIAGNOSIS — E119 Type 2 diabetes mellitus without complications: Secondary | ICD-10-CM | POA: Diagnosis not present

## 2021-08-14 DIAGNOSIS — H25811 Combined forms of age-related cataract, right eye: Secondary | ICD-10-CM | POA: Diagnosis not present

## 2021-08-14 DIAGNOSIS — E113313 Type 2 diabetes mellitus with moderate nonproliferative diabetic retinopathy with macular edema, bilateral: Secondary | ICD-10-CM | POA: Diagnosis not present

## 2021-09-03 DIAGNOSIS — Z89412 Acquired absence of left great toe: Secondary | ICD-10-CM | POA: Diagnosis not present

## 2021-09-03 DIAGNOSIS — L603 Nail dystrophy: Secondary | ICD-10-CM | POA: Diagnosis not present

## 2021-09-03 DIAGNOSIS — Z8631 Personal history of diabetic foot ulcer: Secondary | ICD-10-CM

## 2021-09-03 DIAGNOSIS — B351 Tinea unguium: Secondary | ICD-10-CM | POA: Diagnosis not present

## 2021-09-03 DIAGNOSIS — E11610 Type 2 diabetes mellitus with diabetic neuropathic arthropathy: Secondary | ICD-10-CM | POA: Diagnosis not present

## 2021-09-03 DIAGNOSIS — E1142 Type 2 diabetes mellitus with diabetic polyneuropathy: Secondary | ICD-10-CM | POA: Diagnosis not present

## 2021-09-03 HISTORY — DX: Personal history of diabetic foot ulcer: Z86.31

## 2021-09-10 DIAGNOSIS — E1165 Type 2 diabetes mellitus with hyperglycemia: Secondary | ICD-10-CM | POA: Diagnosis not present

## 2021-09-11 DIAGNOSIS — H259 Unspecified age-related cataract: Secondary | ICD-10-CM | POA: Diagnosis not present

## 2021-09-11 DIAGNOSIS — E1136 Type 2 diabetes mellitus with diabetic cataract: Secondary | ICD-10-CM | POA: Diagnosis not present

## 2021-09-11 DIAGNOSIS — H40033 Anatomical narrow angle, bilateral: Secondary | ICD-10-CM | POA: Diagnosis not present

## 2021-09-11 DIAGNOSIS — Z7901 Long term (current) use of anticoagulants: Secondary | ICD-10-CM | POA: Diagnosis not present

## 2021-09-11 DIAGNOSIS — E113313 Type 2 diabetes mellitus with moderate nonproliferative diabetic retinopathy with macular edema, bilateral: Secondary | ICD-10-CM | POA: Diagnosis not present

## 2021-09-11 DIAGNOSIS — Z794 Long term (current) use of insulin: Secondary | ICD-10-CM | POA: Diagnosis not present

## 2021-09-11 DIAGNOSIS — I10 Essential (primary) hypertension: Secondary | ICD-10-CM | POA: Diagnosis not present

## 2021-09-11 DIAGNOSIS — Z87891 Personal history of nicotine dependence: Secondary | ICD-10-CM | POA: Diagnosis not present

## 2021-09-11 DIAGNOSIS — H25811 Combined forms of age-related cataract, right eye: Secondary | ICD-10-CM | POA: Diagnosis not present

## 2021-10-10 DIAGNOSIS — E1165 Type 2 diabetes mellitus with hyperglycemia: Secondary | ICD-10-CM | POA: Diagnosis not present

## 2021-11-10 DIAGNOSIS — E1165 Type 2 diabetes mellitus with hyperglycemia: Secondary | ICD-10-CM | POA: Diagnosis not present

## 2021-11-13 ENCOUNTER — Other Ambulatory Visit: Payer: Self-pay

## 2021-11-14 ENCOUNTER — Encounter: Payer: Self-pay | Admitting: Cardiology

## 2021-11-14 ENCOUNTER — Other Ambulatory Visit: Payer: Self-pay

## 2021-11-14 ENCOUNTER — Ambulatory Visit: Payer: PPO | Admitting: Cardiology

## 2021-11-14 VITALS — BP 158/60 | HR 62 | Ht 70.0 in | Wt 189.2 lb

## 2021-11-14 DIAGNOSIS — E1142 Type 2 diabetes mellitus with diabetic polyneuropathy: Secondary | ICD-10-CM | POA: Diagnosis not present

## 2021-11-14 DIAGNOSIS — Z8679 Personal history of other diseases of the circulatory system: Secondary | ICD-10-CM | POA: Diagnosis not present

## 2021-11-14 DIAGNOSIS — Z9889 Other specified postprocedural states: Secondary | ICD-10-CM

## 2021-11-14 DIAGNOSIS — I10 Essential (primary) hypertension: Secondary | ICD-10-CM

## 2021-11-14 DIAGNOSIS — I340 Nonrheumatic mitral (valve) insufficiency: Secondary | ICD-10-CM

## 2021-11-14 DIAGNOSIS — E782 Mixed hyperlipidemia: Secondary | ICD-10-CM

## 2021-11-14 DIAGNOSIS — E875 Hyperkalemia: Secondary | ICD-10-CM

## 2021-11-14 MED ORDER — CARVEDILOL 12.5 MG PO TABS: ORAL_TABLET | ORAL | 3 refills | Status: DC

## 2021-11-14 NOTE — Progress Notes (Signed)
Cardiology Office Note:    Date:  11/14/2021   ID:  Joseph [name], DOB [birth year], MRN [id number]  PCP:  Angelina Sheriff, MD  Cardiologist:  Jenean Lindau, MD   Referring MD: Angelina Sheriff, MD    ASSESSMENT:    1. Essential hypertension   2. Mixed hyperlipidemia   3. Moderate mitral regurgitation   4. S/P ablation of atrial fibrillation   5. Diabetic peripheral neuropathy (HCC)    PLAN:    In order of problems listed above:  Coronary artery disease: secondary prevention stressed with the patient.  Importance of compliance with diet medication stressed and he vocalized understanding.  He was advised to walk at least half an hour a day 5 days a week and he promises to do so. Essential hypertension: Blood pressure stable and diet was emphasized.  Lifestyle modification urged.  His blood pressure stable at home. Moderate mitral regurgitation: Medical management at this time.  Importance of exercise stressed.  We will do an echocardiogram to assess this further. Diabetes mellitus and mixed dyslipidemia: On lipid-lowering therapy.  We will do blood work in the next few days.  We will do a hemoglobin A1c.  He has vitamin D deficiency and he wants a recheck of blood work and will do so for him. Patient will be seen in follow-up appointment in 6 months or earlier if the patient has any concerns    Medication Adjustments/Labs and Tests Ordered: Current medicines are reviewed at length with the patient today.  Concerns regarding medicines are outlined above.  No orders of the defined types were placed in this encounter.  No orders of the defined types were placed in this encounter.    No chief complaint on file.    History of Present Illness:    Joseph [name] is a 71 y.o. male.  Patient has past medical history of coronary artery disease, essential hypertension, mixed dyslipidemia and diabetes mellitus.  He is post ablation for atrial fibrillation.  He denies any problems at  this time and takes care of activities of daily living.  No chest pain orthopnea or PND.  At the time of my evaluation, the patient is alert awake oriented and in no distress.  Past Medical History:  Diagnosis Date   Aftercare following surgery 06/05/2020   Atrial fibrillation (HCC)    Benign essential hypertension 01/09/2016   CAD (coronary artery disease)    Coronary artery disease 05/10/2015   Diabetes mellitus ([place])    Diabetic Charcot foot ([place]) 01/09/2016   Diabetic infection of left foot ([place]) 05/17/2020   Diabetic peripheral neuropathy ([place]) 11/27/2019   Diabetic ulcer of left midfoot associated with diabetes mellitus due to underlying condition, with fat layer exposed ([place]) 09/10/2017   Diabetic ulcer of right midfoot associated with type 2 diabetes mellitus, with fat layer exposed ([place]) 12/10/2019   Essential hypertension    Hammertoes of both feet 11/18/2018   History of partial ray amputation of first toe of left foot ([place]) 01/31/2016   Hyperlipidemia 01/09/2016   Infected blister of toe of right foot 01/05/2018   Insulin long-term use ([place]) 01/09/2016   Moderate mitral regurgitation 03/25/2018   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   Pre-ulcerative calluses 07/08/2018   Pure hypercholesterolemia    S/P ablation of atrial fibrillation 10/08/2018   Status post amputation of left great toe ([place]) 01/31/2016   Thyroid nodule 04/10/2016   Toenail fungus 11/17/2017   Type 2 diabetes, uncontrolled, with neuropathy 05/01/2016  Past Surgical History:  Procedure Laterality Date   ABLATION OF DYSRHYTHMIC FOCUS     ATRIAL FIBRILLATION ABLATION N/A 09/24/2018   Procedure: ATRIAL FIBRILLATION ABLATION;  Surgeon: Constance Haw, MD;  Location: [facility];  Service: Cardiovascular;  Laterality: N/A;   big toe removed     cardiac catheterization     CORONARY STENT PLACEMENT     tonsillectomy      Current Medications: Current Meds  Medication Sig   acetaminophen (TYLENOL) 500 MG tablet Take 500  mg by mouth every 4 (four) hours as needed for headache.   apixaban (ELIQUIS) 5 MG TABS tablet Take 1 tablet (5 mg total) by mouth 2 (two) times daily.   aspirin EC 81 MG tablet Take 81 mg by mouth daily.   atorvastatin (LIPITOR) 40 MG tablet Take 1 tablet (40 mg total) by mouth daily.   Cyanocobalamin 2000 MCG TBCR Take 2,000 mcg by mouth daily.   gabapentin (NEURONTIN) 300 MG capsule Take 300 mg by mouth at bedtime.    Glucagon 3 MG/DOSE POWD Place 1 spray into the nose as needed for low blood sugar.   LEVEMIR 100 UNIT/ML injection Inject 5-10 Units into the skin at bedtime.   lisinopril (ZESTRIL) 10 MG tablet Take 1 tablet (10 mg total) by mouth daily.   metoprolol tartrate (LOPRESSOR) 50 MG tablet Take 1 tablet (50 mg total) by mouth 2 (two) times daily.   NOVOLOG FLEXPEN RELION 100 UNIT/ML FlexPen Inject 12-15 Units into the skin with breakfast, with lunch, and with evening meal. Based on sliding scale     Allergies:   Patient has no known allergies.   Social History   Socioeconomic History   Marital status: Unknown    Spouse name: Not on file   Number of children: Not on file   Years of education: Not on file   Highest education level: Not on file  Occupational History   Not on file  Tobacco Use   Smoking status: Former   Smokeless tobacco: Never  Vaping Use   Vaping Use: Never used  Substance and Sexual Activity   Alcohol use: Yes   Drug use: No   Sexual activity: Not on file  Other Topics Concern   Not on file  Social History Narrative   Not on file   Social Determinants of Health   Financial Resource Strain: Not on file  Food Insecurity: Not on file  Transportation Needs: Not on file  Physical Activity: Not on file  Stress: Not on file  Social Connections: Not on file     Family History: The patient's family history includes Cancer in his father; Heart disease in his mother.  ROS:   Please see the history of present illness.    All other systems reviewed  and are negative.  EKGs/Labs/Other Studies Reviewed:    The following studies were reviewed today: EKG reveals sinus rhythm and nonspecific ST-T changes   Recent Labs: No results found for requested labs within last 8760 hours.  Recent Lipid Panel    Component Value Date/Time   CHOL 155 10/25/2020 0847   TRIG 135 10/25/2020 0847   HDL 49 10/25/2020 0847   CHOLHDL 3.2 10/25/2020 0847   LDLCALC 82 10/25/2020 0847    Physical Exam:    VS:  BP (!) 158/60    Pulse 62    Ht 5\' 10"  (1.778 m)    Wt 189 lb 3.2 oz (85.8 kg)    SpO2 96%  BMI 27.15 kg/m     Wt Readings from Last 3 Encounters:  11/14/21 189 lb 3.2 oz (85.8 kg)  04/19/21 181 lb (82.1 kg)  10/20/20 173 lb 6.4 oz (78.7 kg)     GEN: Patient is in no acute distress HEENT: Normal NECK: No JVD; No carotid bruits LYMPHATICS: No lymphadenopathy CARDIAC: Hear sounds regular, 2/6 systolic murmur at the apex. RESPIRATORY:  Clear to auscultation without rales, wheezing or rhonchi  ABDOMEN: Soft, non-tender, non-distended MUSCULOSKELETAL:  No edema; No deformity  SKIN: Warm and dry NEUROLOGIC:  Alert and oriented x 3 PSYCHIATRIC:  Normal affect   Signed, Jenean Lindau, MD  11/14/2021 2:07 PM    [place]

## 2021-11-14 NOTE — Addendum Note (Signed)
Addended by: Jerl Santos R on: 11/14/2021 02:41 PM   Modules accepted: Orders

## 2021-11-14 NOTE — Patient Instructions (Addendum)
Medication Instructions: Your physician recommends that you continue on your current medications as directed. Please refer to the Current Medication list given to you today.    Lab Work: Your physician recommends that you return for lab work in: Next Few Days for BMET, CBC, TSH, LFT's, Lipids, Vit D and Hgb A1C. If you have labs (blood work) drawn today and your tests are completely normal, you will receive your results only by: [place] (if you have MyChart) OR A paper copy in the mail If you have any lab test that is abnormal or we need to change your treatment, we will call you to review the results.   Testing/Procedures: Your physician has requested that you have an echocardiogram. Echocardiography is a painless test that uses sound waves to create images of your heart. It provides your doctor with information about the size and shape of your heart and how well your hearts chambers and valves are working. This procedure takes approximately one hour. There are no restrictions for this procedure.    Follow-Up: At [facility], you and your health needs are our priority.  As part of our continuing mission to provide you with exceptional heart care, we have created designated Provider Care Teams.  These Care Teams include your primary Cardiologist (physician) and Advanced Practice Providers (APPs -  Physician Assistants and Nurse Practitioners) who all work together to provide you with the care you need, when you need it.  We recommend signing up for the patient portal called "MyChart".  Sign up information is provided on this After Visit Summary.  MyChart is used to connect with patients for Virtual Visits (Telemedicine).  Patients are able to view lab/test results, encounter notes, upcoming appointments, etc.  Non-urgent messages can be sent to your provider as well.   To learn more about what you can do with MyChart, go to [email or web address].    Your next appointment:   6  month(s)  The format for your next appointment:   In Person  Provider:   Jyl Heinz, MD    Other Instructions

## 2021-11-15 DIAGNOSIS — I340 Nonrheumatic mitral (valve) insufficiency: Secondary | ICD-10-CM | POA: Diagnosis not present

## 2021-11-15 DIAGNOSIS — E782 Mixed hyperlipidemia: Secondary | ICD-10-CM | POA: Diagnosis not present

## 2021-11-15 DIAGNOSIS — Z8679 Personal history of other diseases of the circulatory system: Secondary | ICD-10-CM | POA: Diagnosis not present

## 2021-11-15 DIAGNOSIS — Z1321 Encounter for screening for nutritional disorder: Secondary | ICD-10-CM | POA: Diagnosis not present

## 2021-11-15 DIAGNOSIS — I10 Essential (primary) hypertension: Secondary | ICD-10-CM | POA: Diagnosis not present

## 2021-11-15 DIAGNOSIS — Z9889 Other specified postprocedural states: Secondary | ICD-10-CM | POA: Diagnosis not present

## 2021-11-15 DIAGNOSIS — E1142 Type 2 diabetes mellitus with diabetic polyneuropathy: Secondary | ICD-10-CM | POA: Diagnosis not present

## 2021-11-16 LAB — LIPID PANEL
Chol/HDL Ratio: 2.7 ratio (ref 0.0–5.0)
Cholesterol, Total: 158 mg/dL (ref 100–199)
HDL: 58 mg/dL (ref >39)
LDL Chol Calc (NIH): 82 mg/dL (ref 0–99)
Triglycerides: 101 mg/dL (ref 0–149)
VLDL Cholesterol Cal: 18 mg/dL (ref 5–40)

## 2021-11-16 LAB — CBC
Hematocrit: 47.1 % (ref 37.5–51.0)
Hemoglobin: 15.5 g/dL (ref 13.0–17.7)
MCH: 30.3 pg (ref 26.6–33.0)
MCHC: 32.9 g/dL (ref 31.5–35.7)
MCV: 92 fL (ref 79–97)
Platelets: 119 10*3/uL — ABNORMAL LOW (ref 150–450)
RBC: 5.12 x10E6/uL (ref 4.14–5.80)
RDW: 12.1 % (ref 11.6–15.4)
WBC: 5.4 10*3/uL (ref 3.4–10.8)

## 2021-11-16 LAB — HEPATIC FUNCTION PANEL
ALT: 17 IU/L (ref 0–44)
AST: 23 IU/L (ref 0–40)
Albumin: 4.1 g/dL (ref 3.8–4.8)
Alkaline Phosphatase: 131 IU/L — ABNORMAL HIGH (ref 44–121)
Bilirubin Total: 0.6 mg/dL (ref 0.0–1.2)
Bilirubin, Direct: 0.17 mg/dL (ref 0.00–0.40)
Total Protein: 6 g/dL (ref 6.0–8.5)

## 2021-11-16 LAB — BASIC METABOLIC PANEL
BUN/Creatinine Ratio: 22 (ref 10–24)
BUN: 27 mg/dL (ref 8–27)
CO2: 24 mmol/L (ref 20–29)
Calcium: 9.1 mg/dL (ref 8.6–10.2)
Chloride: 103 mmol/L (ref 96–106)
Creatinine, Ser: 1.25 mg/dL (ref 0.76–1.27)
Glucose: 292 mg/dL — ABNORMAL HIGH (ref 70–99)
Potassium: 5.6 mmol/L — ABNORMAL HIGH (ref 3.5–5.2)
Sodium: 138 mmol/L (ref 134–144)
eGFR: 62 mL/min/{1.73_m2} (ref >59)

## 2021-11-16 LAB — HEMOGLOBIN A1C
Est. average glucose Bld gHb Est-mCnc: 186 mg/dL
Hgb A1c MFr Bld: 8.1 % — ABNORMAL HIGH (ref 4.8–5.6)

## 2021-11-16 LAB — VITAMIN D 25 HYDROXY (VIT D DEFICIENCY, FRACTURES): Vit D, 25-Hydroxy: 21.8 ng/mL — ABNORMAL LOW (ref 30.0–100.0)

## 2021-11-16 LAB — TSH: TSH: 3.73 u[IU]/mL (ref 0.450–4.500)

## 2021-11-19 ENCOUNTER — Telehealth: Payer: Self-pay | Admitting: Cardiology

## 2021-11-19 DIAGNOSIS — E875 Hyperkalemia: Secondary | ICD-10-CM | POA: Diagnosis not present

## 2021-11-19 LAB — BASIC METABOLIC PANEL
BUN/Creatinine Ratio: 22 (ref 10–24)
BUN: 28 mg/dL — ABNORMAL HIGH (ref 8–27)
CO2: 23 mmol/L (ref 20–29)
Calcium: 9.3 mg/dL (ref 8.6–10.2)
Chloride: 104 mmol/L (ref 96–106)
Creatinine, Ser: 1.28 mg/dL — ABNORMAL HIGH (ref 0.76–1.27)
Glucose: 254 mg/dL — ABNORMAL HIGH (ref 70–99)
Potassium: 5.2 mmol/L (ref 3.5–5.2)
Sodium: 139 mmol/L (ref 134–144)
eGFR: 60 mL/min/{1.73_m2} (ref >59)

## 2021-11-19 MED ORDER — SODIUM POLYSTYRENE SULFONATE PO POWD
Freq: Once | ORAL | 0 refills | Status: AC
Start: 2021-11-19 — End: 2021-11-19

## 2021-11-19 NOTE — Telephone Encounter (Signed)
Pt states he just started taking a new medication on Saturday and due to side effects he will not be taking it/ Please call [phone] to advise  Thank you,  Ammie Dalton

## 2021-11-19 NOTE — Telephone Encounter (Signed)
Medication was Carvedilol which was sent in error and cancelled within 3 mins. Pt advised of error and advised not to take medication.

## 2021-11-19 NOTE — Addendum Note (Signed)
Addended by: Truddie Hidden on: 11/19/2021 08:06 AM   Modules accepted: Orders

## 2021-11-21 ENCOUNTER — Ambulatory Visit (INDEPENDENT_AMBULATORY_CARE_PROVIDER_SITE_OTHER): Payer: PPO

## 2021-11-21 ENCOUNTER — Other Ambulatory Visit: Payer: Self-pay

## 2021-11-21 DIAGNOSIS — Z9889 Other specified postprocedural states: Secondary | ICD-10-CM | POA: Diagnosis not present

## 2021-11-21 DIAGNOSIS — E782 Mixed hyperlipidemia: Secondary | ICD-10-CM

## 2021-11-21 DIAGNOSIS — E875 Hyperkalemia: Secondary | ICD-10-CM

## 2021-11-21 DIAGNOSIS — E1142 Type 2 diabetes mellitus with diabetic polyneuropathy: Secondary | ICD-10-CM

## 2021-11-21 DIAGNOSIS — Z8679 Personal history of other diseases of the circulatory system: Secondary | ICD-10-CM | POA: Diagnosis not present

## 2021-11-21 DIAGNOSIS — I340 Nonrheumatic mitral (valve) insufficiency: Secondary | ICD-10-CM | POA: Diagnosis not present

## 2021-11-21 DIAGNOSIS — I10 Essential (primary) hypertension: Secondary | ICD-10-CM | POA: Diagnosis not present

## 2021-11-21 LAB — ECHOCARDIOGRAM COMPLETE
Area-P 1/2: 2.56 cm2
MV M vel: 6.13 m/s
MV Peak grad: 150.1 mmHg
S' Lateral: 4.4 cm

## 2021-11-26 ENCOUNTER — Other Ambulatory Visit: Payer: Self-pay

## 2021-11-27 DIAGNOSIS — Z87891 Personal history of nicotine dependence: Secondary | ICD-10-CM | POA: Diagnosis not present

## 2021-11-27 DIAGNOSIS — E041 Nontoxic single thyroid nodule: Secondary | ICD-10-CM | POA: Diagnosis not present

## 2021-11-27 DIAGNOSIS — Z794 Long term (current) use of insulin: Secondary | ICD-10-CM | POA: Diagnosis not present

## 2021-11-27 DIAGNOSIS — Z89422 Acquired absence of other left toe(s): Secondary | ICD-10-CM | POA: Diagnosis not present

## 2021-11-27 DIAGNOSIS — I10 Essential (primary) hypertension: Secondary | ICD-10-CM | POA: Diagnosis not present

## 2021-11-27 DIAGNOSIS — E785 Hyperlipidemia, unspecified: Secondary | ICD-10-CM | POA: Diagnosis not present

## 2021-11-27 DIAGNOSIS — E1140 Type 2 diabetes mellitus with diabetic neuropathy, unspecified: Secondary | ICD-10-CM | POA: Diagnosis not present

## 2021-11-28 ENCOUNTER — Ambulatory Visit: Payer: PPO | Admitting: Cardiology

## 2021-11-28 ENCOUNTER — Telehealth: Payer: Self-pay

## 2021-11-28 ENCOUNTER — Other Ambulatory Visit: Payer: Self-pay

## 2021-11-28 ENCOUNTER — Ambulatory Visit (INDEPENDENT_AMBULATORY_CARE_PROVIDER_SITE_OTHER): Payer: PPO

## 2021-11-28 ENCOUNTER — Encounter: Payer: Self-pay | Admitting: Cardiology

## 2021-11-28 VITALS — BP 144/90 | HR 56 | Ht 70.0 in | Wt 185.6 lb

## 2021-11-28 DIAGNOSIS — R079 Chest pain, unspecified: Secondary | ICD-10-CM

## 2021-11-28 DIAGNOSIS — I10 Essential (primary) hypertension: Secondary | ICD-10-CM

## 2021-11-28 DIAGNOSIS — E782 Mixed hyperlipidemia: Secondary | ICD-10-CM

## 2021-11-28 DIAGNOSIS — I2510 Atherosclerotic heart disease of native coronary artery without angina pectoris: Secondary | ICD-10-CM

## 2021-11-28 DIAGNOSIS — Z8679 Personal history of other diseases of the circulatory system: Secondary | ICD-10-CM | POA: Diagnosis not present

## 2021-11-28 DIAGNOSIS — E11610 Type 2 diabetes mellitus with diabetic neuropathic arthropathy: Secondary | ICD-10-CM

## 2021-11-28 DIAGNOSIS — I429 Cardiomyopathy, unspecified: Secondary | ICD-10-CM

## 2021-11-28 DIAGNOSIS — I340 Nonrheumatic mitral (valve) insufficiency: Secondary | ICD-10-CM

## 2021-11-28 DIAGNOSIS — I4891 Unspecified atrial fibrillation: Secondary | ICD-10-CM

## 2021-11-28 DIAGNOSIS — Z9889 Other specified postprocedural states: Secondary | ICD-10-CM

## 2021-11-28 HISTORY — DX: Cardiomyopathy, unspecified: I42.9

## 2021-11-28 MED ORDER — METOPROLOL SUCCINATE ER 100 MG PO TB24: 100.0000 mg | ORAL_TABLET | Freq: Every day | ORAL | 3 refills | Status: DC

## 2021-11-28 MED ORDER — ENTRESTO 24-26 MG PO TABS
1.0000 | ORAL_TABLET | Freq: Two times a day (BID) | ORAL | 2 refills | Status: DC
Start: 2021-11-28 — End: 2021-12-14

## 2021-11-28 NOTE — Telephone Encounter (Signed)
Nurse visit scheduled for patient to blood pressure, pulse and lab work done.

## 2021-11-28 NOTE — Patient Instructions (Signed)
Medication Instructions:  Your physician has recommended you make the following change in your medication:   START:Toprol XL 100 mg daily START: Entresto 24-26 mg twice daily (wait until Friday to start) STOP: Zestril STOP: Lopressor  *If you need a refill on your cardiac medications before your next appointment, please call your pharmacy*   Lab Work: None If you have labs (blood work) drawn today and your tests are completely normal, you will receive your results only by: [place] (if you have MyChart) OR A paper copy in the mail If you have any lab test that is abnormal or we need to change your treatment, we will call you to review the results.   Testing/Procedures: A zio monitor was ordered today. It will remain on for 14 days. You will then return monitor and event diary in provided box. It takes 1-2 weeks for report to be downloaded and returned to [place]. We will call you with the results. If monitor falls off or has orange flashing light, please call Zio for further instructions.     [facility] [address] [place], [place] [address] Phone:  [phone]    Please arrive 15 minutes prior to your appointment time for registration and insurance purposes.  The test will take approximately 3 to 4 hours to complete; you may bring reading material.  If someone comes with you to your appointment, they will need to remain in the main lobby due to limited space in the testing area. **If you are pregnant or breastfeeding, please notify the nuclear lab prior to your appointment**  How to prepare for your Myocardial Perfusion Test: Do not eat or drink 3 hours prior to your test, except you may have water. Do not consume products containing caffeine (regular or decaffeinated) 12 hours prior to your test. (ex: coffee, chocolate, sodas, tea). Do bring a list of your current medications with you.  If not listed below, you may take your medications as  normal. HOLD Erectile dysfunction medication:  HOLD diabetic medication/insulin the morning of the test: Levemir,  Do wear comfortable clothes (no dresses or overalls) and walking shoes, tennis shoes preferred (No heels or open toe shoes are allowed). Do NOT wear cologne, perfume, aftershave, or lotions (deodorant is allowed). If these instructions are not followed, your test will have to be rescheduled.  Please report to [address] for your test.  If you have questions or concerns about your appointment, you can call the [facility] at [phone].  If you cannot keep your appointment, please provide 24 hours notification to the Nuclear Lab, to avoid a possible $50 charge to your account.    Follow-Up: At [facility], you and your health needs are our priority.  As part of our continuing mission to provide you with exceptional heart care, we have created designated Provider Care Teams.  These Care Teams include your primary Cardiologist (physician) and Advanced Practice Providers (APPs -  Physician Assistants and Nurse Practitioners) who all work together to provide you with the care you need, when you need it.  We recommend signing up for the patient portal called "MyChart".  Sign up information is provided on this After Visit Summary.  MyChart is used to connect with patients for Virtual Visits (Telemedicine).  Patients are able to view lab/test results, encounter notes, upcoming appointments, etc.  Non-urgent messages can be sent to your provider as well.   To learn more about what you  can do with MyChart, go to [email or web address].    Your next appointment:   1 month(s)  The format for your next appointment:   In Person  Provider:   Jyl Heinz, MD    Other Instructions None

## 2021-11-28 NOTE — Progress Notes (Signed)
Cardiology Office Note:    Date:  11/28/2021   ID:  Joseph [name], DOB Apr 20, 1951, MRN [id number]  PCP:  Joseph Sheriff, MD  Cardiologist:  Joseph Lindau, MD   Referring MD: Joseph Sheriff, MD    ASSESSMENT:    1. Coronary artery disease involving native coronary artery of native heart without angina pectoris   2. Benign essential hypertension   3. Mixed hyperlipidemia   4. Moderate mitral regurgitation   5. Diabetic Charcot foot ([place])   6. S/P ablation of atrial fibrillation   7. Cardiomyopathy, unspecified type ([place])    PLAN:    In order of problems listed above:  Coronary artery disease: Secondary prevention stressed with the patient.  Importance of compliance with diet medication stressed and vocalized understanding. Uncontrolled diabetes mellitus: Hemoglobin A1c is markedly elevated and I questioned him about this.  He has seen the specialist yesterday for discussion about this. Newly diagnosed cardiomyopathy: This has come to me is a little bit of a surprise.  I called him and to discuss about this issue.  We will do a Lexiscan sestamibi for recurrence of obstructive coronary artery disease.  Also in the interim I have discussed with him about Delene Loll and is agreeable.  He will stop lisinopril today and not take it anymore.  We will start Entresto beginning Friday morning.  Low-dose.  He will come back next week for a nurse visit for Chem-7 and blood pressure and pulse check.  Also we will switch metoprolol tartrate to succinate 100 mg once daily. Essential hypertension: Blood pressure is elevated and the above measures should help him.  We will keep a track of his blood pressures.  Lifestyle modification: Diet issues were discussed. Mixed dyslipidemia: On statin therapy and lipids were reviewed. I also want to see if any of his cardiomyopathy is from tachycardia so we will do a 2-week monitor.  He is post ablation for atrial fibrillation but I just want to be  sure. Patient will be seen in follow-up appointment in 4 weeks or earlier if the patient has any concerns    Medication Adjustments/Labs and Tests Ordered: Current medicines are reviewed at length with the patient today.  Concerns regarding medicines are outlined above.  No orders of the defined types were placed in this encounter.  No orders of the defined types were placed in this encounter.    Chief Complaint  Patient presents with   Results    Echo     History of Present Illness:    Joseph [name] is a 71 y.o. male.  Patient has past medical history of coronary artery disease post CABG surgery, essential hypertension, mixed dyslipidemia, uncontrolled diabetes mellitus and post ablation for atrial fibrillation.  He denies any problems at this time and takes care of activities of daily living.  No chest pain orthopnea or PND.  He leads a sedentary lifestyle.  His echocardiogram has revealed ejection fraction of about 30%.  For this reason he is here for follow-up.  This is a new finding.  Past Medical History:  Diagnosis Date   Aftercare following surgery 06/05/2020   Atrial fibrillation (HCC)    Benign essential hypertension 01/09/2016   CAD (coronary artery disease)    Coronary artery disease 05/10/2015   Diabetes mellitus ([place])    Diabetic Charcot foot ([place]) 01/09/2016   Diabetic infection of left foot ([place]) 05/17/2020   Diabetic peripheral neuropathy ([place]) 11/27/2019   Diabetic ulcer of left midfoot associated  with diabetes mellitus due to underlying condition, with fat layer exposed ([place]) 09/10/2017   Diabetic ulcer of right midfoot associated with type 2 diabetes mellitus, with fat layer exposed ([place]) 12/10/2019   Essential hypertension    Hammertoes of both feet 11/18/2018   History of diabetic ulcer of foot 09/03/2021   History of partial ray amputation of first toe of left foot ([place]) 01/31/2016   Hyperlipidemia 01/09/2016   Infected blister of toe of right foot 01/05/2018    Insulin long-term use ([place]) 01/09/2016   Moderate mitral regurgitation 03/25/2018   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   Pre-ulcerative calluses 07/08/2018   Pure hypercholesterolemia    S/P ablation of atrial fibrillation 10/08/2018   Status post amputation of left great toe ([place]) 01/31/2016   Thyroid nodule 04/10/2016   Toenail fungus 11/17/2017   Type 2 diabetes, uncontrolled, with neuropathy 05/01/2016    Past Surgical History:  Procedure Laterality Date   ABLATION OF DYSRHYTHMIC FOCUS     ATRIAL FIBRILLATION ABLATION N/A 09/24/2018   Procedure: ATRIAL FIBRILLATION ABLATION;  Surgeon: Constance Haw, MD;  Location: [facility];  Service: Cardiovascular;  Laterality: N/A;   big toe removed     cardiac catheterization     CORONARY STENT PLACEMENT     tonsillectomy      Current Medications: Current Meds  Medication Sig   acetaminophen (TYLENOL) 500 MG tablet Take 500 mg by mouth every 4 (four) hours as needed for headache.   apixaban (ELIQUIS) 5 MG TABS tablet Take 1 tablet (5 mg total) by mouth 2 (two) times daily.   aspirin EC 81 MG tablet Take 81 mg by mouth daily.   atorvastatin (LIPITOR) 40 MG tablet Take 1 tablet (40 mg total) by mouth daily.   carvedilol (COREG) 12.5 MG tablet Take 12.5 mg by mouth 2 (two) times daily.   Cyanocobalamin 2000 MCG TBCR Take 2,000 mcg by mouth daily.   gabapentin (NEURONTIN) 300 MG capsule Take 300 mg by mouth at bedtime.    Glucagon 3 MG/DOSE POWD Place 1 spray into the nose as needed for low blood sugar.   LEVEMIR 100 UNIT/ML injection Inject 5-10 Units into the skin at bedtime.   lisinopril (ZESTRIL) 10 MG tablet Take 1 tablet (10 mg total) by mouth daily.   metoprolol tartrate (LOPRESSOR) 50 MG tablet Take 1 tablet (50 mg total) by mouth 2 (two) times daily.   NOVOLOG FLEXPEN RELION 100 UNIT/ML FlexPen Inject 12-15 Units into the skin with breakfast, with lunch, and with evening meal. Based on sliding scale     Allergies:   Patient has no  known allergies.   Social History   Socioeconomic History   Marital status: Unknown    Spouse name: Not on file   Number of children: Not on file   Years of education: Not on file   Highest education level: Not on file  Occupational History   Not on file  Tobacco Use   Smoking status: Former   Smokeless tobacco: Never  Vaping Use   Vaping Use: Never used  Substance and Sexual Activity   Alcohol use: Yes   Drug use: No   Sexual activity: Not on file  Other Topics Concern   Not on file  Social History Narrative   Not on file   Social Determinants of Health   Financial Resource Strain: Not on file  Food Insecurity: Not on file  Transportation Needs: Not on file  Physical Activity: Not on file  Stress: Not on file  Social Connections: Not on file     Family History: The patient's family history includes Cancer in his father; Heart disease in his mother.  ROS:   Please see the history of present illness.    All other systems reviewed and are negative.  EKGs/Labs/Other Studies Reviewed:    The following studies were reviewed today: IMPRESSIONS     1. Left ventricular ejection fraction, by estimation, is 30%%. The left  ventricle has severely decreased function. The left ventricle demonstrates  global hypokinesis. Left ventricular diastolic parameters are consistent  with Grade III diastolic  dysfunction (restrictive). Elevated left atrial pressure.   2. Right ventricular systolic function is moderately reduced. The right  ventricular size is normal.   3. The mitral valve is normal in structure. Mild to moderate mitral valve  regurgitation. No evidence of mitral stenosis.   4. The aortic valve is tricuspid. Aortic valve regurgitation is not  visualized. Aortic valve sclerosis is present, with no evidence of aortic  valve stenosis.   5. The inferior vena cava is normal in size with greater than 50%  respiratory variability, suggesting right atrial pressure of 3  mmHg.    Recent Labs: 11/15/2021: ALT 17; Hemoglobin 15.5; Platelets 119; TSH 3.730 11/19/2021: BUN 28; Creatinine, Ser 1.28; Potassium 5.2; Sodium 139  Recent Lipid Panel    Component Value Date/Time   CHOL 158 11/15/2021 0803   TRIG 101 11/15/2021 0803   HDL 58 11/15/2021 0803   CHOLHDL 2.7 11/15/2021 0803   LDLCALC 82 11/15/2021 0803    Physical Exam:    VS:  BP (!) 144/90 (BP Location: Left Arm, Patient Position: Sitting)    Pulse (!) 56    Ht 5\' 10"  (1.778 m)    Wt 185 lb 9.6 oz (84.2 kg)    SpO2 98%    BMI 26.63 kg/m     Wt Readings from Last 3 Encounters:  11/28/21 185 lb 9.6 oz (84.2 kg)  11/14/21 189 lb 3.2 oz (85.8 kg)  04/19/21 181 lb (82.1 kg)     GEN: Patient is in no acute distress HEENT: Normal NECK: No JVD; No carotid bruits LYMPHATICS: No lymphadenopathy CARDIAC: Hear sounds regular, 2/6 systolic murmur at the apex. RESPIRATORY:  Clear to auscultation without rales, wheezing or rhonchi  ABDOMEN: Soft, non-tender, non-distended MUSCULOSKELETAL:  No edema; No deformity  SKIN: Warm and dry NEUROLOGIC:  Alert and oriented x 3 PSYCHIATRIC:  Normal affect   Signed, Joseph Lindau, MD  11/28/2021 10:27 AM    [place]

## 2021-11-28 NOTE — Addendum Note (Signed)
Addended by: Jyl Heinz R on: 11/28/2021 11:10 AM   Modules accepted: Orders

## 2021-12-03 DIAGNOSIS — Z794 Long term (current) use of insulin: Secondary | ICD-10-CM | POA: Diagnosis not present

## 2021-12-03 DIAGNOSIS — L603 Nail dystrophy: Secondary | ICD-10-CM | POA: Diagnosis not present

## 2021-12-03 DIAGNOSIS — Z89412 Acquired absence of left great toe: Secondary | ICD-10-CM | POA: Diagnosis not present

## 2021-12-03 DIAGNOSIS — E1142 Type 2 diabetes mellitus with diabetic polyneuropathy: Secondary | ICD-10-CM | POA: Diagnosis not present

## 2021-12-03 DIAGNOSIS — Z8631 Personal history of diabetic foot ulcer: Secondary | ICD-10-CM | POA: Diagnosis not present

## 2021-12-03 DIAGNOSIS — E11610 Type 2 diabetes mellitus with diabetic neuropathic arthropathy: Secondary | ICD-10-CM | POA: Diagnosis not present

## 2021-12-04 ENCOUNTER — Telehealth (HOSPITAL_COMMUNITY): Payer: Self-pay | Admitting: *Deleted

## 2021-12-04 NOTE — Telephone Encounter (Signed)
Left message on voicemail per DPR in reference to upcoming appointment scheduled on 12/11/2021 at 11:15 with detailed instructions given per Myocardial Perfusion Study Information Sheet for the test. LM to arrive 15 minutes early, and that it is imperative to arrive on time for appointment to keep from having the test rescheduled. If you need to cancel or reschedule your appointment, please call the office within 24 hours of your appointment. Failure to do so may result in a cancellation of your appointment, and a $50 no show fee. Phone number given for call back for any questions.

## 2021-12-05 ENCOUNTER — Ambulatory Visit: Payer: PPO

## 2021-12-05 ENCOUNTER — Other Ambulatory Visit: Payer: Self-pay

## 2021-12-05 VITALS — BP 146/84 | HR 64 | Ht 70.0 in | Wt 187.6 lb

## 2021-12-05 DIAGNOSIS — E782 Mixed hyperlipidemia: Secondary | ICD-10-CM | POA: Diagnosis not present

## 2021-12-05 DIAGNOSIS — I429 Cardiomyopathy, unspecified: Secondary | ICD-10-CM

## 2021-12-05 DIAGNOSIS — E11610 Type 2 diabetes mellitus with diabetic neuropathic arthropathy: Secondary | ICD-10-CM | POA: Diagnosis not present

## 2021-12-05 DIAGNOSIS — Z9889 Other specified postprocedural states: Secondary | ICD-10-CM | POA: Diagnosis not present

## 2021-12-05 DIAGNOSIS — Z8679 Personal history of other diseases of the circulatory system: Secondary | ICD-10-CM | POA: Diagnosis not present

## 2021-12-05 DIAGNOSIS — I2510 Atherosclerotic heart disease of native coronary artery without angina pectoris: Secondary | ICD-10-CM | POA: Diagnosis not present

## 2021-12-05 DIAGNOSIS — R079 Chest pain, unspecified: Secondary | ICD-10-CM | POA: Diagnosis not present

## 2021-12-05 DIAGNOSIS — I10 Essential (primary) hypertension: Secondary | ICD-10-CM | POA: Diagnosis not present

## 2021-12-05 DIAGNOSIS — I340 Nonrheumatic mitral (valve) insufficiency: Secondary | ICD-10-CM | POA: Diagnosis not present

## 2021-12-05 NOTE — Progress Notes (Signed)
Reason for visit: BP/HR check and labs  Name of MD requesting visit: Revankar  H&P: Cardiomyopathy  ROS related to problem: Medication change to Entresto 24-26 mg  Assessment and plan per MD: Revankar will review and make changes as needed. No changes made after Dr. Julien Nordmann review.

## 2021-12-06 DIAGNOSIS — E113393 Type 2 diabetes mellitus with moderate nonproliferative diabetic retinopathy without macular edema, bilateral: Secondary | ICD-10-CM | POA: Diagnosis not present

## 2021-12-06 DIAGNOSIS — H26492 Other secondary cataract, left eye: Secondary | ICD-10-CM | POA: Diagnosis not present

## 2021-12-06 DIAGNOSIS — H353131 Nonexudative age-related macular degeneration, bilateral, early dry stage: Secondary | ICD-10-CM | POA: Diagnosis not present

## 2021-12-06 LAB — BASIC METABOLIC PANEL
BUN/Creatinine Ratio: 19 (ref 10–24)
BUN: 24 mg/dL (ref 8–27)
CO2: 25 mmol/L (ref 20–29)
Calcium: 9 mg/dL (ref 8.6–10.2)
Chloride: 107 mmol/L — ABNORMAL HIGH (ref 96–106)
Creatinine, Ser: 1.29 mg/dL — ABNORMAL HIGH (ref 0.76–1.27)
Glucose: 199 mg/dL — ABNORMAL HIGH (ref 70–99)
Potassium: 4.8 mmol/L (ref 3.5–5.2)
Sodium: 142 mmol/L (ref 134–144)
eGFR: 60 mL/min/{1.73_m2} (ref >59)

## 2021-12-11 ENCOUNTER — Other Ambulatory Visit: Payer: Self-pay

## 2021-12-11 ENCOUNTER — Ambulatory Visit (INDEPENDENT_AMBULATORY_CARE_PROVIDER_SITE_OTHER): Payer: PPO

## 2021-12-11 VITALS — Ht 70.0 in | Wt 185.0 lb

## 2021-12-11 DIAGNOSIS — R079 Chest pain, unspecified: Secondary | ICD-10-CM

## 2021-12-11 DIAGNOSIS — E1165 Type 2 diabetes mellitus with hyperglycemia: Secondary | ICD-10-CM | POA: Diagnosis not present

## 2021-12-11 DIAGNOSIS — E875 Hyperkalemia: Secondary | ICD-10-CM

## 2021-12-11 MED ORDER — REGADENOSON 0.4 MG/5ML IV SOLN
0.4000 mg | Freq: Once | INTRAVENOUS | Status: AC
  Administered 2021-12-11: 0.4 mg via INTRAVENOUS

## 2021-12-11 MED ORDER — TECHNETIUM TC 99M TETROFOSMIN IV KIT
11.0000 | PACK | Freq: Once | INTRAVENOUS | Status: AC | PRN
  Administered 2021-12-11: 11 via INTRAVENOUS

## 2021-12-11 MED ORDER — TECHNETIUM TC 99M TETROFOSMIN IV KIT
31.7000 | PACK | Freq: Once | INTRAVENOUS | Status: AC | PRN
  Administered 2021-12-11: 31.7 via INTRAVENOUS

## 2021-12-12 LAB — MYOCARDIAL PERFUSION IMAGING
LV dias vol: 157 mL (ref 62–150)
LV sys vol: 91 mL
Nuc Stress EF: 42 %
Peak HR: 77 {beats}/min
Rest HR: 62 {beats}/min
Rest Nuclear Isotope Dose: 11 mCi
SDS: 5
SRS: 6
SSS: 11
ST Depression (mm): 0 mm
Stress Nuclear Isotope Dose: 31.7 mCi
TID: 1.07

## 2021-12-14 MED ORDER — ENTRESTO 49-51 MG PO TABS: 1.0000 | ORAL_TABLET | Freq: Two times a day (BID) | ORAL | 12 refills | Status: DC

## 2021-12-18 DIAGNOSIS — I340 Nonrheumatic mitral (valve) insufficiency: Secondary | ICD-10-CM | POA: Diagnosis not present

## 2021-12-18 DIAGNOSIS — I2510 Atherosclerotic heart disease of native coronary artery without angina pectoris: Secondary | ICD-10-CM | POA: Diagnosis not present

## 2021-12-18 DIAGNOSIS — I10 Essential (primary) hypertension: Secondary | ICD-10-CM | POA: Diagnosis not present

## 2021-12-18 DIAGNOSIS — E782 Mixed hyperlipidemia: Secondary | ICD-10-CM | POA: Diagnosis not present

## 2021-12-21 DIAGNOSIS — R079 Chest pain, unspecified: Secondary | ICD-10-CM | POA: Diagnosis not present

## 2021-12-22 LAB — BASIC METABOLIC PANEL
BUN/Creatinine Ratio: 23 (ref 10–24)
BUN: 27 mg/dL (ref 8–27)
CO2: 28 mmol/L (ref 20–29)
Calcium: 9 mg/dL (ref 8.6–10.2)
Chloride: 105 mmol/L (ref 96–106)
Creatinine, Ser: 1.19 mg/dL (ref 0.76–1.27)
Glucose: 217 mg/dL — ABNORMAL HIGH (ref 70–99)
Potassium: 6.2 mmol/L — ABNORMAL HIGH (ref 3.5–5.2)
Sodium: 140 mmol/L (ref 134–144)
eGFR: 66 mL/min/{1.73_m2} (ref >59)

## 2021-12-24 MED ORDER — SODIUM POLYSTYRENE SULFONATE PO POWD: Freq: Once | ORAL | 0 refills | Status: AC

## 2021-12-31 DIAGNOSIS — E875 Hyperkalemia: Secondary | ICD-10-CM | POA: Diagnosis not present

## 2021-12-31 LAB — POTASSIUM: Potassium: 4.4 mmol/L (ref 3.5–5.2)

## 2022-01-01 ENCOUNTER — Ambulatory Visit: Payer: PPO | Admitting: Cardiology

## 2022-01-01 ENCOUNTER — Encounter: Payer: Self-pay | Admitting: Cardiology

## 2022-01-01 VITALS — BP 188/82 | HR 64 | Ht 70.0 in | Wt 189.6 lb

## 2022-01-01 DIAGNOSIS — I10 Essential (primary) hypertension: Secondary | ICD-10-CM

## 2022-01-01 DIAGNOSIS — Z8679 Personal history of other diseases of the circulatory system: Secondary | ICD-10-CM

## 2022-01-01 DIAGNOSIS — I340 Nonrheumatic mitral (valve) insufficiency: Secondary | ICD-10-CM

## 2022-01-01 DIAGNOSIS — I429 Cardiomyopathy, unspecified: Secondary | ICD-10-CM | POA: Diagnosis not present

## 2022-01-01 DIAGNOSIS — Z9889 Other specified postprocedural states: Secondary | ICD-10-CM

## 2022-01-01 DIAGNOSIS — E782 Mixed hyperlipidemia: Secondary | ICD-10-CM | POA: Diagnosis not present

## 2022-01-01 DIAGNOSIS — I2510 Atherosclerotic heart disease of native coronary artery without angina pectoris: Secondary | ICD-10-CM

## 2022-01-01 NOTE — Progress Notes (Signed)
?Cardiology Office Note:   ? ?Date:  01/01/2022  ? ?ID:  Joseph [name], DOB 1951-06-23, MRN [id number] ? ?PCP:  Angelina Sheriff, MD  ?Cardiologist:  Jenean Lindau, MD  ? ?Referring MD: Angelina Sheriff, MD  ? ? ?ASSESSMENT:   ? ?1. Coronary artery disease involving native coronary artery of native heart without angina pectoris   ?2. Moderate mitral regurgitation   ?3. Mixed hyperlipidemia   ?4. Cardiomyopathy, unspecified type ([place])   ?5. Benign essential hypertension   ?6. S/P ablation of atrial fibrillation   ? ?PLAN:   ? ?In order of problems listed above: ? ?Coronary artery disease: Secondary prevention stressed with patient.  Importance of compliance with diet medication stressed and vocalized understanding.  He has issues with ambulation and therefore riding a bicycle stationary bicycle was recommended.  He promises to do better. ?Cardiomyopathy: Discussed with him at length.  He is on guideline directed medical therapy.  I wanted to introduce medication such as Jardiance but is not keen on it at this time.  He want to continue current medications. ?Hyperkalemia: We are monitoring his potassium.  Diet was emphasized.  Low potassium intake diet was recommended.  He will be back in the next few days for blood work for repeat potassium evaluation. ?Essential hypertension: His blood pressure readings at home are fine.  He has an element of whitecoat hypertension.  He mentioned to me the numbers. ?Patient will be seen in follow-up appointment in 4 weeks or earlier if the patient has any concerns ? ? ? ?Medication Adjustments/Labs and Tests Ordered: ?Current medicines are reviewed at length with the patient today.  Concerns regarding medicines are outlined above.  ?Orders Placed This Encounter  ?Procedures  ? ECHOCARDIOGRAM COMPLETE  ? ?No orders of the defined types were placed in this encounter. ? ? ? ?No chief complaint on file. ?  ? ?History of Present Illness:   ? ?Joseph [name] is a 71 y.o. male.  Patient  has past medical history of coronary artery disease, essential hypertension, mixed dyslipidemia, diabetes mellitus, atrial fibrillation post ablation.  He denies any problems at this time and takes care of activities of daily living.  Recent echocardiogram revealed moderately depressed ejection fraction and an attempt was made to open to introduce guideline directed medical therapy.  At the time of my evaluation, the patient is alert awake oriented and in no distress. ? ?Past Medical History:  ?Diagnosis Date  ? Aftercare following surgery 06/05/2020  ? Atrial fibrillation ([place])   ? Benign essential hypertension 01/09/2016  ? CAD (coronary artery disease)   ? Cardiomyopathy ([place]) 11/28/2021  ? Coronary artery disease 05/10/2015  ? Diabetes mellitus ([place])   ? Diabetic Charcot foot ([place]) 01/09/2016  ? Diabetic infection of left foot ([place]) 05/17/2020  ? Diabetic peripheral neuropathy ([place]) 11/27/2019  ? Diabetic ulcer of left midfoot associated with diabetes mellitus due to underlying condition, with fat layer exposed ([place]) 09/10/2017  ? Diabetic ulcer of right midfoot associated with type 2 diabetes mellitus, with fat layer exposed ([place]) 12/10/2019  ? Essential hypertension   ? Hammertoes of both feet 11/18/2018  ? History of diabetic ulcer of foot 09/03/2021  ? History of partial ray amputation of first toe of left foot ([place]) 01/31/2016  ? Hyperlipidemia 01/09/2016  ? Infected blister of toe of right foot 01/05/2018  ? Insulin long-term use ([place]) 01/09/2016  ? Moderate mitral regurgitation 03/25/2018  ? 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  ? Pre-ulcerative calluses 07/08/2018  ?  Pure hypercholesterolemia   ? S/P ablation of atrial fibrillation 10/08/2018  ? Status post amputation of left great toe ([place]) 01/31/2016  ? Thyroid nodule 04/10/2016  ? Toenail fungus 11/17/2017  ? Type 2 diabetes, uncontrolled, with neuropathy 05/01/2016  ? ? ?Past Surgical History:  ?Procedure Laterality Date  ? ABLATION OF DYSRHYTHMIC FOCUS    ? ATRIAL FIBRILLATION  ABLATION N/A 09/24/2018  ? Procedure: ATRIAL FIBRILLATION ABLATION;  Surgeon: Constance Haw, MD;  Location: [facility];  Service: Cardiovascular;  Laterality: N/A;  ? big toe removed    ? cardiac catheterization    ? CORONARY STENT PLACEMENT    ? tonsillectomy    ? ? ?Current Medications: ?Current Meds  ?Medication Sig  ? acetaminophen (TYLENOL) 500 MG tablet Take 500 mg by mouth every 4 (four) hours as needed for headache.  ? apixaban (ELIQUIS) 5 MG TABS tablet Take 1 tablet (5 mg total) by mouth 2 (two) times daily.  ? aspirin EC 81 MG tablet Take 81 mg by mouth daily.  ? atorvastatin (LIPITOR) 40 MG tablet Take 1 tablet (40 mg total) by mouth daily.  ? Cyanocobalamin 2000 MCG TBCR Take 2,000 mcg by mouth daily.  ? gabapentin (NEURONTIN) 300 MG capsule Take 300 mg by mouth at bedtime.   ? Glucagon 3 MG/DOSE POWD Place 1 spray into the nose as needed for low blood sugar.  ? LEVEMIR 100 UNIT/ML injection Inject 5-10 Units into the skin at bedtime.  ? metoprolol succinate (TOPROL-XL) 100 MG 24 hr tablet Take 1 tablet (100 mg total) by mouth daily. Take with or immediately following a meal.  ? NOVOLOG FLEXPEN RELION 100 UNIT/ML FlexPen Inject 12-15 Units into the skin with breakfast, with lunch, and with evening meal. Based on sliding scale  ? sacubitril-valsartan (ENTRESTO) 49-51 MG Take 1 tablet by mouth 2 (two) times daily.  ?  ? ?Allergies:   Patient has no known allergies.  ? ?Social History  ? ?Socioeconomic History  ? Marital status: Unknown  ?  Spouse name: Not on file  ? Number of children: Not on file  ? Years of education: Not on file  ? Highest education level: Not on file  ?Occupational History  ? Not on file  ?Tobacco Use  ? Smoking status: Former  ? Smokeless tobacco: Never  ?Vaping Use  ? Vaping Use: Never used  ?Substance and Sexual Activity  ? Alcohol use: Yes  ? Drug use: No  ? Sexual activity: Not on file  ?Other Topics Concern  ? Not on file  ?Social History Narrative  ? Not on file   ? ?Social Determinants of Health  ? ?Financial Resource Strain: Not on file  ?Food Insecurity: Not on file  ?Transportation Needs: Not on file  ?Physical Activity: Not on file  ?Stress: Not on file  ?Social Connections: Not on file  ?  ? ?Family History: ?The patient's family history includes Cancer in his father; Heart disease in his mother. ? ?ROS:   ?Please see the history of present illness.    ?All other systems reviewed and are negative. ? ?EKGs/Labs/Other Studies Reviewed:   ? ?The following studies were reviewed today: ?I discussed my findings with the patient at length ? ? ?Recent Labs: ?11/15/2021: ALT 17; Hemoglobin 15.5; Platelets 119; TSH 3.730 ?12/21/2021: BUN 27; Creatinine, Ser 1.19; Sodium 140 ?12/31/2021: Potassium 4.4  ?Recent Lipid Panel ?   ?Component Value Date/Time  ? CHOL 158 11/15/2021 0803  ? TRIG 101 11/15/2021  0803  ? HDL 58 11/15/2021 0803  ? CHOLHDL 2.7 11/15/2021 0803  ? [place] 82 11/15/2021 0803  ? ? ?Physical Exam:   ? ?VS:  BP (!) 188/82   Pulse 64   Ht '5\' 10"'$  (1.778 m)   Wt 189 lb 9.6 oz (86 kg)   SpO2 95%   BMI 27.20 kg/m?    ? ?Wt Readings from Last 3 Encounters:  ?01/01/22 189 lb 9.6 oz (86 kg)  ?12/11/21 185 lb (83.9 kg)  ?12/05/21 187 lb 9.6 oz (85.1 kg)  ?  ? ?GEN: Patient is in no acute distress ?HEENT: Normal ?NECK: No JVD; No carotid bruits ?LYMPHATICS: No lymphadenopathy ?CARDIAC: Hear sounds regular, 2/6 systolic murmur at the apex. ?RESPIRATORY:  Clear to auscultation without rales, wheezing or rhonchi  ?ABDOMEN: Soft, non-tender, non-distended ?MUSCULOSKELETAL:  No edema; No deformity  ?SKIN: Warm and dry ?NEUROLOGIC:  Alert and oriented x 3 ?PSYCHIATRIC:  Normal affect  ? ?Signed, ?Jenean Lindau, MD  ?01/01/2022 4:10 PM    ?[place]  ?

## 2022-01-01 NOTE — Patient Instructions (Signed)
Medication Instructions:  ?Your physician recommends that you continue on your current medications as directed. Please refer to the Current Medication list given to you today.  ?*If you need a refill on your cardiac medications before your next appointment, please call your pharmacy* ? ? ?Lab Work: ?None ?If you have labs (blood work) drawn today and your tests are completely normal, you will receive your results only by: ?MyChart Message (if you have MyChart) OR ?A paper copy in the mail ?If you have any lab test that is abnormal or we need to change your treatment, we will call you to review the results. ? ? ?Testing/Procedures: ?Echocardiogram ?Your physician has requested that you have an echocardiogram. Echocardiography is a painless test that uses sound waves to create images of your heart. It provides your doctor with information about the size and shape of your heart and how well your heart?s chambers and valves are working. This procedure takes approximately one hour. There are no restrictions for this procedure.  ? ? ?Follow-Up: ?At [facility], you and your health needs are our priority.  As part of our continuing mission to provide you with exceptional heart care, we have created designated Provider Care Teams.  These Care Teams include your primary Cardiologist (physician) and Advanced Practice Providers (APPs -  Physician Assistants and Nurse Practitioners) who all work together to provide you with the care you need, when you need it. ? ?We recommend signing up for the patient portal called "MyChart".  Sign up information is provided on this After Visit Summary.  MyChart is used to connect with patients for Virtual Visits (Telemedicine).  Patients are able to view lab/test results, encounter notes, upcoming appointments, etc.  Non-urgent messages can be sent to your provider as well.   ?To learn more about what you can do with MyChart, go to [email or web address].   ? ?Your next appointment:   ?2  month(s) ? ?The format for your next appointment:   ?In Person ? ?Provider:   ?Jyl Heinz, MD  ? ? ?Other Instructions ?None  ?

## 2022-01-07 ENCOUNTER — Ambulatory Visit (INDEPENDENT_AMBULATORY_CARE_PROVIDER_SITE_OTHER): Payer: PPO

## 2022-01-07 ENCOUNTER — Other Ambulatory Visit: Payer: Self-pay

## 2022-01-07 DIAGNOSIS — I2510 Atherosclerotic heart disease of native coronary artery without angina pectoris: Secondary | ICD-10-CM | POA: Diagnosis not present

## 2022-01-07 LAB — ECHOCARDIOGRAM COMPLETE
Area-P 1/2: 3.16 cm2
S' Lateral: 4.3 cm

## 2022-01-08 ENCOUNTER — Telehealth: Payer: Self-pay

## 2022-01-08 DIAGNOSIS — E1165 Type 2 diabetes mellitus with hyperglycemia: Secondary | ICD-10-CM | POA: Diagnosis not present

## 2022-01-08 NOTE — Telephone Encounter (Signed)
-----   Message from Jenean Lindau, MD sent at 01/07/2022  5:37 PM EDT ----- ?Ejection fraction is improved.  The results of the study is unremarkable. Please inform patient. I will discuss in detail at next appointment. Cc  primary care/referring physician ?Jenean Lindau, MD 01/07/2022 5:37 PM  ?

## 2022-01-08 NOTE — Telephone Encounter (Signed)
Patient notified of results, Results faxed to PCP.  ?

## 2022-02-08 DIAGNOSIS — E1165 Type 2 diabetes mellitus with hyperglycemia: Secondary | ICD-10-CM | POA: Diagnosis not present

## 2022-03-04 DIAGNOSIS — L603 Nail dystrophy: Secondary | ICD-10-CM | POA: Diagnosis not present

## 2022-03-04 DIAGNOSIS — E11610 Type 2 diabetes mellitus with diabetic neuropathic arthropathy: Secondary | ICD-10-CM | POA: Diagnosis not present

## 2022-03-04 DIAGNOSIS — E1142 Type 2 diabetes mellitus with diabetic polyneuropathy: Secondary | ICD-10-CM | POA: Diagnosis not present

## 2022-03-04 DIAGNOSIS — Z8631 Personal history of diabetic foot ulcer: Secondary | ICD-10-CM | POA: Diagnosis not present

## 2022-03-04 DIAGNOSIS — Z89412 Acquired absence of left great toe: Secondary | ICD-10-CM | POA: Diagnosis not present

## 2022-03-04 DIAGNOSIS — Z794 Long term (current) use of insulin: Secondary | ICD-10-CM | POA: Diagnosis not present

## 2022-03-08 ENCOUNTER — Ambulatory Visit: Payer: PPO | Admitting: Cardiology

## 2022-03-08 DIAGNOSIS — H26493 Other secondary cataract, bilateral: Secondary | ICD-10-CM | POA: Diagnosis not present

## 2022-03-08 DIAGNOSIS — H26492 Other secondary cataract, left eye: Secondary | ICD-10-CM | POA: Diagnosis not present

## 2022-03-10 DIAGNOSIS — E1165 Type 2 diabetes mellitus with hyperglycemia: Secondary | ICD-10-CM | POA: Diagnosis not present

## 2022-03-18 ENCOUNTER — Ambulatory Visit: Payer: PPO | Admitting: Cardiology

## 2022-03-18 ENCOUNTER — Encounter: Payer: Self-pay | Admitting: Cardiology

## 2022-03-18 VITALS — BP 172/82 | HR 66 | Ht 70.0 in | Wt 193.4 lb

## 2022-03-18 DIAGNOSIS — E782 Mixed hyperlipidemia: Secondary | ICD-10-CM | POA: Diagnosis not present

## 2022-03-18 DIAGNOSIS — Z8679 Personal history of other diseases of the circulatory system: Secondary | ICD-10-CM

## 2022-03-18 DIAGNOSIS — I10 Essential (primary) hypertension: Secondary | ICD-10-CM | POA: Diagnosis not present

## 2022-03-18 DIAGNOSIS — I2510 Atherosclerotic heart disease of native coronary artery without angina pectoris: Secondary | ICD-10-CM | POA: Diagnosis not present

## 2022-03-18 DIAGNOSIS — E1142 Type 2 diabetes mellitus with diabetic polyneuropathy: Secondary | ICD-10-CM | POA: Diagnosis not present

## 2022-03-18 DIAGNOSIS — Z9889 Other specified postprocedural states: Secondary | ICD-10-CM

## 2022-03-18 NOTE — Patient Instructions (Signed)
Medication Instructions:  Your physician recommends that you continue on your current medications as directed. Please refer to the Current Medication list given to you today.  *If you need a refill on your cardiac medications before your next appointment, please call your pharmacy*   Lab Work: Your physician recommends that you have a BMET done in the office today.   If you have labs (blood work) drawn today and your tests are completely normal, you will receive your results only by: [place] (if you have MyChart) OR A paper copy in the mail If you have any lab test that is abnormal or we need to change your treatment, we will call you to review the results.   Testing/Procedures: None ordered   Follow-Up: At [facility], you and your health needs are our priority.  As part of our continuing mission to provide you with exceptional heart care, we have created designated Provider Care Teams.  These Care Teams include your primary Cardiologist (physician) and Advanced Practice Providers (APPs -  Physician Assistants and Nurse Practitioners) who all work together to provide you with the care you need, when you need it.  We recommend signing up for the patient portal called "MyChart".  Sign up information is provided on this After Visit Summary.  MyChart is used to connect with patients for Virtual Visits (Telemedicine).  Patients are able to view lab/test results, encounter notes, upcoming appointments, etc.  Non-urgent messages can be sent to your provider as well.   To learn more about what you can do with MyChart, go to [email or web address].    Your next appointment:   4 month(s)  The format for your next appointment:   In Person  Provider:   Jyl Heinz, MD   Other Instructions NA

## 2022-03-18 NOTE — Progress Notes (Signed)
Cardiology Office Note:    Date:  03/18/2022   ID:  Joseph [name], DOB Mar 22, 1951, MRN [id number]  PCP:  Angelina Sheriff, MD  Cardiologist:  Jenean Lindau, MD   Referring MD: Angelina Sheriff, MD    ASSESSMENT:    1. Essential hypertension   2. Coronary artery disease involving native coronary artery of native heart without angina pectoris   3. S/P ablation of atrial fibrillation   4. Diabetic peripheral neuropathy ([place])   5. Mixed hyperlipidemia    PLAN:    In order of problems listed above:  Coronary artery disease: Secondary prevention stressed with the patient.  Importance of compliance with diet medication stressed any vocalized understanding. Essential hypertension: Blood pressure stable he has an element of whitecoat hypertension His blood pressures are excellent at home. History of cardiomyopathy with normal ejection fraction at this time.  Patient will continue current medications including Entresto and will get a Chem-7 today. Mixed dyslipidemia, diabetes mellitus: Lifestyle modification and diet emphasized.  Also history of seeing exercise stationary bicycle and this is helping him. Patient will be seen in follow-up appointment in 6 months or earlier if the patient has any concerns    Medication Adjustments/Labs and Tests Ordered: Current medicines are reviewed at length with the patient today.  Concerns regarding medicines are outlined above.  Orders Placed This Encounter  Procedures   Basic metabolic panel   No orders of the defined types were placed in this encounter.    No chief complaint on file.    History of Present Illness:    Joseph [name] is a 71 y.o. male.  Patient has past medical history of essential hypertension, mixed dyslipidemia, diabetes mellitus and obesity.  He is post atrial fibrillation ablation.  He denies any problems at this time and takes care of activities of daily living.  No chest pain orthopnea or PND.  At the time of my  evaluation, the patient is alert awake oriented and in no distress.  He has history of cardiomyopathy but ejection fraction is now normal and he is happy about it.  Past Medical History:  Diagnosis Date   Aftercare following surgery 06/05/2020   Atrial fibrillation ([place])    Benign essential hypertension 01/09/2016   CAD (coronary artery disease)    Cardiomyopathy ([place]) 11/28/2021   Coronary artery disease 05/10/2015   Diabetes mellitus ([place])    Diabetic Charcot foot ([place]) 01/09/2016   Diabetic infection of left foot ([place]) 05/17/2020   Diabetic peripheral neuropathy ([place]) 11/27/2019   Diabetic ulcer of left midfoot associated with diabetes mellitus due to underlying condition, with fat layer exposed ([place]) 09/10/2017   Diabetic ulcer of right midfoot associated with type 2 diabetes mellitus, with fat layer exposed ([place]) 12/10/2019   Essential hypertension    Hammertoes of both feet 11/18/2018   History of diabetic ulcer of foot 09/03/2021   History of partial ray amputation of first toe of left foot ([place]) 01/31/2016   Hyperlipidemia 01/09/2016   Infected blister of toe of right foot 01/05/2018   Insulin long-term use ([place]) 01/09/2016   Moderate mitral regurgitation 03/25/2018   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   Pre-ulcerative calluses 07/08/2018   Pure hypercholesterolemia    S/P ablation of atrial fibrillation 10/08/2018   Status post amputation of left great toe ([place]) 01/31/2016   Thyroid nodule 04/10/2016   Toenail fungus 11/17/2017   Type 2 diabetes, uncontrolled, with neuropathy 05/01/2016    Past Surgical History:  Procedure Laterality Date  ABLATION OF DYSRHYTHMIC FOCUS     ATRIAL FIBRILLATION ABLATION N/A 09/24/2018   Procedure: ATRIAL FIBRILLATION ABLATION;  Surgeon: Constance Haw, MD;  Location: [facility];  Service: Cardiovascular;  Laterality: N/A;   big toe removed     cardiac catheterization     CORONARY STENT PLACEMENT     tonsillectomy      Current Medications: Current  Meds  Medication Sig   acetaminophen (TYLENOL) 500 MG tablet Take 500 mg by mouth every 4 (four) hours as needed for headache.   apixaban (ELIQUIS) 5 MG TABS tablet Take 1 tablet (5 mg total) by mouth 2 (two) times daily.   aspirin EC 81 MG tablet Take 81 mg by mouth daily.   atorvastatin (LIPITOR) 40 MG tablet Take 1 tablet (40 mg total) by mouth daily.   Cyanocobalamin 2000 MCG TBCR Take 2,000 mcg by mouth daily.   gabapentin (NEURONTIN) 300 MG capsule Take 300 mg by mouth at bedtime.    Glucagon 3 MG/DOSE POWD Place 1 spray into the nose as needed for low blood sugar.   LEVEMIR 100 UNIT/ML injection Inject 5-10 Units into the skin at bedtime.   metoprolol succinate (TOPROL-XL) 100 MG 24 hr tablet Take 1 tablet (100 mg total) by mouth daily. Take with or immediately following a meal.   NOVOLOG FLEXPEN RELION 100 UNIT/ML FlexPen Inject 12-15 Units into the skin with breakfast, with lunch, and with evening meal. Based on sliding scale   sacubitril-valsartan (ENTRESTO) 49-51 MG Take 1 tablet by mouth 2 (two) times daily.     Allergies:   Patient has no known allergies.   Social History   Socioeconomic History   Marital status: Unknown    Spouse name: Not on file   Number of children: Not on file   Years of education: Not on file   Highest education level: Not on file  Occupational History   Not on file  Tobacco Use   Smoking status: Former   Smokeless tobacco: Never  Vaping Use   Vaping Use: Never used  Substance and Sexual Activity   Alcohol use: Yes   Drug use: No   Sexual activity: Not on file  Other Topics Concern   Not on file  Social History Narrative   Not on file   Social Determinants of Health   Financial Resource Strain: Not on file  Food Insecurity: Not on file  Transportation Needs: Not on file  Physical Activity: Not on file  Stress: Not on file  Social Connections: Not on file     Family History: The patient's family history includes Cancer in his  father; Heart disease in his mother.  ROS:   Please see the history of present illness.    All other systems reviewed and are negative.  EKGs/Labs/Other Studies Reviewed:    The following studies were reviewed today: I discussed my findings with the patient at length.   Recent Labs: 11/15/2021: ALT 17; Hemoglobin 15.5; Platelets 119; TSH 3.730 12/21/2021: BUN 27; Creatinine, Ser 1.19; Sodium 140 12/31/2021: Potassium 4.4  Recent Lipid Panel    Component Value Date/Time   CHOL 158 11/15/2021 0803   TRIG 101 11/15/2021 0803   HDL 58 11/15/2021 0803   CHOLHDL 2.7 11/15/2021 0803   LDLCALC 82 11/15/2021 0803    Physical Exam:    VS:  BP (!) 172/82   Pulse 66   Ht '5\' 10"'$  (1.778 m)   Wt 193 lb 6.4 oz (87.7 kg)  SpO2 95%   BMI 27.75 kg/m     Wt Readings from Last 3 Encounters:  03/18/22 193 lb 6.4 oz (87.7 kg)  01/01/22 189 lb 9.6 oz (86 kg)  12/11/21 185 lb (83.9 kg)     GEN: Patient is in no acute distress HEENT: Normal NECK: No JVD; No carotid bruits LYMPHATICS: No lymphadenopathy CARDIAC: Hear sounds regular, 2/6 systolic murmur at the apex. RESPIRATORY:  Clear to auscultation without rales, wheezing or rhonchi  ABDOMEN: Soft, non-tender, non-distended MUSCULOSKELETAL:  No edema; No deformity  SKIN: Warm and dry NEUROLOGIC:  Alert and oriented x 3 PSYCHIATRIC:  Normal affect   Signed, Jenean Lindau, MD  03/18/2022 4:39 PM    [facility]

## 2022-03-19 LAB — BASIC METABOLIC PANEL
BUN/Creatinine Ratio: 24 (ref 10–24)
BUN: 25 mg/dL (ref 8–27)
CO2: 20 mmol/L (ref 20–29)
Calcium: 8.9 mg/dL (ref 8.6–10.2)
Chloride: 111 mmol/L — ABNORMAL HIGH (ref 96–106)
Creatinine, Ser: 1.05 mg/dL (ref 0.76–1.27)
Glucose: 90 mg/dL (ref 70–99)
Potassium: 4.8 mmol/L (ref 3.5–5.2)
Sodium: 145 mmol/L — ABNORMAL HIGH (ref 134–144)
eGFR: 76 mL/min/{1.73_m2} (ref >59)

## 2022-03-27 DIAGNOSIS — I10 Essential (primary) hypertension: Secondary | ICD-10-CM | POA: Diagnosis not present

## 2022-03-27 DIAGNOSIS — E041 Nontoxic single thyroid nodule: Secondary | ICD-10-CM | POA: Diagnosis not present

## 2022-03-27 DIAGNOSIS — E785 Hyperlipidemia, unspecified: Secondary | ICD-10-CM | POA: Diagnosis not present

## 2022-03-27 DIAGNOSIS — E11610 Type 2 diabetes mellitus with diabetic neuropathic arthropathy: Secondary | ICD-10-CM | POA: Diagnosis not present

## 2022-03-27 DIAGNOSIS — Z794 Long term (current) use of insulin: Secondary | ICD-10-CM | POA: Diagnosis not present

## 2022-03-27 DIAGNOSIS — Z89422 Acquired absence of other left toe(s): Secondary | ICD-10-CM | POA: Diagnosis not present

## 2022-03-27 DIAGNOSIS — E1140 Type 2 diabetes mellitus with diabetic neuropathy, unspecified: Secondary | ICD-10-CM | POA: Diagnosis not present

## 2022-04-10 DIAGNOSIS — E1165 Type 2 diabetes mellitus with hyperglycemia: Secondary | ICD-10-CM | POA: Diagnosis not present

## 2022-05-10 ENCOUNTER — Other Ambulatory Visit: Payer: Self-pay | Admitting: Cardiology

## 2022-05-10 ENCOUNTER — Other Ambulatory Visit: Payer: Self-pay | Admitting: *Deleted

## 2022-05-10 DIAGNOSIS — E1165 Type 2 diabetes mellitus with hyperglycemia: Secondary | ICD-10-CM | POA: Diagnosis not present

## 2022-05-10 DIAGNOSIS — I2510 Atherosclerotic heart disease of native coronary artery without angina pectoris: Secondary | ICD-10-CM

## 2022-05-10 DIAGNOSIS — I4891 Unspecified atrial fibrillation: Secondary | ICD-10-CM

## 2022-05-10 MED ORDER — APIXABAN 5 MG PO TABS: 5.0000 mg | ORAL_TABLET | Freq: Two times a day (BID) | ORAL | 1 refills | Status: DC

## 2022-05-10 NOTE — Telephone Encounter (Signed)
 *  STAT* If patient is at the pharmacy, call can be transferred to refill team.   1. Which medications need to be refilled? (please list name of each medication and dose if known) apixaban (ELIQUIS) 5 MG TABS tablet  2. Which pharmacy/location (including street and city if local pharmacy) is medication to be sent to? [place], [place]  3. Do they need a 30 day or 90 day supply? 90 days  Pt is out of meds, needs refill today

## 2022-05-10 NOTE — Telephone Encounter (Signed)
Sent as requesting 90 day supply and 30 days of Eliquis

## 2022-05-10 NOTE — Telephone Encounter (Signed)
Prescription refill request for Eliquis received. Indication: Afib  Last office visit:03/18/22 (Revankar)  Scr: 1.05 (03/18/22) Age: 71 Weight: 87.7kg  Appropriate dose and refill sent to requested pharmacy.

## 2022-05-10 NOTE — Telephone Encounter (Signed)
Eliquis '5mg'$  refill request received. Patient is 71 years old, weight-87.7kg, Crea-1.05 on 03/18/2022, Diagnosis-Afib, and last seen by Dr. Geraldo Pitter on 03/18/2022. Dose is appropriate based on dosing criteria. This refill was sent on 05/10/2022 at 1123am. Will not resend this duplicate.

## 2022-05-20 ENCOUNTER — Encounter: Payer: Self-pay | Admitting: Cardiology

## 2022-05-20 ENCOUNTER — Ambulatory Visit: Payer: PPO | Admitting: Cardiology

## 2022-05-20 VITALS — BP 160/80 | HR 55 | Ht 70.0 in | Wt 194.0 lb

## 2022-05-20 DIAGNOSIS — I255 Ischemic cardiomyopathy: Secondary | ICD-10-CM

## 2022-05-20 DIAGNOSIS — E08621 Diabetes mellitus due to underlying condition with foot ulcer: Secondary | ICD-10-CM

## 2022-05-20 DIAGNOSIS — Z8679 Personal history of other diseases of the circulatory system: Secondary | ICD-10-CM

## 2022-05-20 DIAGNOSIS — E781 Pure hyperglyceridemia: Secondary | ICD-10-CM | POA: Diagnosis not present

## 2022-05-20 DIAGNOSIS — I2510 Atherosclerotic heart disease of native coronary artery without angina pectoris: Secondary | ICD-10-CM | POA: Diagnosis not present

## 2022-05-20 DIAGNOSIS — L97422 Non-pressure chronic ulcer of left heel and midfoot with fat layer exposed: Secondary | ICD-10-CM

## 2022-05-20 DIAGNOSIS — E7800 Pure hypercholesterolemia, unspecified: Secondary | ICD-10-CM | POA: Diagnosis not present

## 2022-05-20 DIAGNOSIS — Z9889 Other specified postprocedural states: Secondary | ICD-10-CM

## 2022-05-20 NOTE — Progress Notes (Signed)
Cardiology Office Note:    Date:  05/20/2022   ID:  Joseph [name], DOB [birth year], MRN [id number]  PCP:  Angelina Sheriff, MD  Cardiologist:  Jenean Lindau, MD   Referring MD: Angelina Sheriff, MD    ASSESSMENT:    1. Coronary artery disease involving native coronary artery of native heart without angina pectoris   2. Pure hypertriglyceridemia   3. S/P ablation of atrial fibrillation   4. Pure hypercholesterolemia   5. Ischemic cardiomyopathy   6. Diabetic ulcer of left midfoot associated with diabetes mellitus due to underlying condition, with fat layer exposed ([place])    PLAN:    In order of problems listed above:  Coronary artery disease: Secondary prevention stressed with the patient.  Importance of compliance with diet medication stressed any vocalized understanding. Essential hypertension: Blood pressure stable and diet was emphasized.  Lifestyle modification urged He has an element of whitecoat hypertension and his blood pressure is fine.  He brought the readings. Mixed dyslipidemia: On statin therapy and followed by primary care.  Lipids reviewed. History of cardiomyopathy: Now normal ejection fraction on guideline directed medical therapy. Diabetes mellitus: Elevated hemoglobin A1c and I cautioned him about this and he understands.  He will speak to his primary care about it. Patient will be seen in follow-up appointment in 9 months or earlier if the patient has any concerns    Medication Adjustments/Labs and Tests Ordered: Current medicines are reviewed at length with the patient today.  Concerns regarding medicines are outlined above.  Orders Placed This Encounter  Procedures   EKG 12-Lead   No orders of the defined types were placed in this encounter.    Chief Complaint  Patient presents with   Follow-up     History of Present Illness:    Joseph [name] is a 71 y.o. male.  Patient has past medical history of coronary artery disease, essential  hypertension, dyslipidemia and diabetes mellitus.  He denies any problems at this time and takes care of activities of daily living.  No chest pain orthopnea or PND.  He tells me that he is feeling much better now that he is on Entresto.  He has an element of whitecoat hypertension and his blood pressures are fine at home and he keeps a track of them and mentioned to me the readings.  Past Medical History:  Diagnosis Date   Aftercare following surgery 06/05/2020   Atrial fibrillation ([place])    Benign essential hypertension 01/09/2016   CAD (coronary artery disease)    Cardiomyopathy ([place]) 11/28/2021   Coronary artery disease 05/10/2015   Diabetes mellitus ([place])    Diabetic Charcot foot ([place]) 01/09/2016   Diabetic infection of left foot ([place]) 05/17/2020   Diabetic peripheral neuropathy ([place]) 11/27/2019   Diabetic ulcer of left midfoot associated with diabetes mellitus due to underlying condition, with fat layer exposed ([place]) 09/10/2017   Diabetic ulcer of right midfoot associated with type 2 diabetes mellitus, with fat layer exposed ([place]) 12/10/2019   Essential hypertension    Hammertoes of both feet 11/18/2018   History of diabetic ulcer of foot 09/03/2021   History of partial ray amputation of first toe of left foot ([place]) 01/31/2016   Hyperlipidemia 01/09/2016   Infected blister of toe of right foot 01/05/2018   Insulin long-term use ([place]) 01/09/2016   Moderate mitral regurgitation 03/25/2018   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   Pre-ulcerative calluses 07/08/2018   Pure hypercholesterolemia    S/P ablation of atrial  fibrillation 10/08/2018   Status post amputation of left great toe ([place]) 01/31/2016   Thyroid nodule 04/10/2016   Toenail fungus 11/17/2017   Type 2 diabetes, uncontrolled, with neuropathy 05/01/2016    Past Surgical History:  Procedure Laterality Date   ABLATION OF DYSRHYTHMIC FOCUS     ATRIAL FIBRILLATION ABLATION N/A 09/24/2018   Procedure: ATRIAL FIBRILLATION ABLATION;  Surgeon: Constance Haw, MD;  Location: [facility];  Service: Cardiovascular;  Laterality: N/A;   big toe removed     cardiac catheterization     CORONARY STENT PLACEMENT     tonsillectomy      Current Medications: Current Meds  Medication Sig   acetaminophen (TYLENOL) 500 MG tablet Take 500 mg by mouth every 4 (four) hours as needed for headache.   apixaban (ELIQUIS) 5 MG TABS tablet Take 1 tablet (5 mg total) by mouth 2 (two) times daily.   aspirin EC 81 MG tablet Take 81 mg by mouth daily.   atorvastatin (LIPITOR) 40 MG tablet Take 1 tablet (40 mg total) by mouth daily.   Cyanocobalamin 2000 MCG TBCR Take 2,000 mcg by mouth daily.   gabapentin (NEURONTIN) 300 MG capsule Take 300 mg by mouth at bedtime.    Glucagon 3 MG/DOSE POWD Place 1 spray into the nose as needed for low blood sugar.   LEVEMIR 100 UNIT/ML injection Inject 5-10 Units into the skin at bedtime.   metoprolol succinate (TOPROL-XL) 100 MG 24 hr tablet Take 1 tablet (100 mg total) by mouth daily. Take with or immediately following a meal.   NOVOLOG FLEXPEN RELION 100 UNIT/ML FlexPen Inject 12-15 Units into the skin with breakfast, with lunch, and with evening meal. Based on sliding scale   sacubitril-valsartan (ENTRESTO) 49-51 MG Take 1 tablet by mouth 2 (two) times daily.     Allergies:   Patient has no known allergies.   Social History   Socioeconomic History   Marital status: Unknown    Spouse name: Not on file   Number of children: Not on file   Years of education: Not on file   Highest education level: Not on file  Occupational History   Not on file  Tobacco Use   Smoking status: Former   Smokeless tobacco: Never  Vaping Use   Vaping Use: Never used  Substance and Sexual Activity   Alcohol use: Yes   Drug use: No   Sexual activity: Not on file  Other Topics Concern   Not on file  Social History Narrative   Not on file   Social Determinants of Health   Financial Resource Strain: Not on file  Food  Insecurity: Not on file  Transportation Needs: Not on file  Physical Activity: Not on file  Stress: Not on file  Social Connections: Not on file     Family History: The patient's family history includes Cancer in his father; Heart disease in his mother.  ROS:   Please see the history of present illness.    All other systems reviewed and are negative.  EKGs/Labs/Other Studies Reviewed:    The following studies were reviewed today: I discussed my findings with the patient at length   Recent Labs: 11/15/2021: ALT 17; Hemoglobin 15.5; Platelets 119; TSH 3.730 03/18/2022: BUN 25; Creatinine, Ser 1.05; Potassium 4.8; Sodium 145  Recent Lipid Panel    Component Value Date/Time   CHOL 158 11/15/2021 0803   TRIG 101 11/15/2021 0803   HDL 58 11/15/2021 0803   CHOLHDL  2.7 11/15/2021 0803   LDLCALC 82 11/15/2021 0803    Physical Exam:    VS:  BP (!) 160/80 (BP Location: Left Arm, Patient Position: Sitting, Cuff Size: Normal)   Pulse (!) 55   Ht '5\' 10"'$  (1.778 m)   Wt 194 lb (88 kg)   SpO2 96%   BMI 27.84 kg/m     Wt Readings from Last 3 Encounters:  05/20/22 194 lb (88 kg)  03/18/22 193 lb 6.4 oz (87.7 kg)  01/01/22 189 lb 9.6 oz (86 kg)     GEN: Patient is in no acute distress HEENT: Normal NECK: No JVD; No carotid bruits LYMPHATICS: No lymphadenopathy CARDIAC: Hear sounds regular, 2/6 systolic murmur at the apex. RESPIRATORY:  Clear to auscultation without rales, wheezing or rhonchi  ABDOMEN: Soft, non-tender, non-distended MUSCULOSKELETAL:  No edema; No deformity  SKIN: Warm and dry NEUROLOGIC:  Alert and oriented x 3 PSYCHIATRIC:  Normal affect   Signed, Jenean Lindau, MD  05/20/2022 2:16 PM    [facility]

## 2022-05-20 NOTE — Patient Instructions (Signed)

## 2022-05-23 ENCOUNTER — Other Ambulatory Visit: Payer: Self-pay | Admitting: *Deleted

## 2022-05-23 NOTE — Patient Outreach (Signed)
  Care Coordination   05/23/2022 Name: Neko Boyajian MRN: [id number] DOB: 1951-09-23   Care Coordination Outreach Attempts:  An unsuccessful telephone outreach was attempted today to offer the patient information about available care coordination services as a benefit of their health plan.   Follow Up Plan:  Additional outreach attempts will be made to offer the patient care coordination information and services.   Encounter Outcome:  No Answer  Care Coordination Interventions Activated:  Yes   Care Coordination Interventions:  No, not indicated    [facility] [phone]

## 2022-05-24 ENCOUNTER — Other Ambulatory Visit: Payer: Self-pay | Admitting: Cardiology

## 2022-05-24 DIAGNOSIS — E782 Mixed hyperlipidemia: Secondary | ICD-10-CM

## 2022-05-24 DIAGNOSIS — I2510 Atherosclerotic heart disease of native coronary artery without angina pectoris: Secondary | ICD-10-CM

## 2022-06-03 DIAGNOSIS — B351 Tinea unguium: Secondary | ICD-10-CM | POA: Diagnosis not present

## 2022-06-03 DIAGNOSIS — Z89412 Acquired absence of left great toe: Secondary | ICD-10-CM | POA: Diagnosis not present

## 2022-06-03 DIAGNOSIS — Z8631 Personal history of diabetic foot ulcer: Secondary | ICD-10-CM | POA: Diagnosis not present

## 2022-06-03 DIAGNOSIS — E11610 Type 2 diabetes mellitus with diabetic neuropathic arthropathy: Secondary | ICD-10-CM | POA: Diagnosis not present

## 2022-06-03 DIAGNOSIS — Z794 Long term (current) use of insulin: Secondary | ICD-10-CM | POA: Diagnosis not present

## 2022-06-03 DIAGNOSIS — E1142 Type 2 diabetes mellitus with diabetic polyneuropathy: Secondary | ICD-10-CM | POA: Diagnosis not present

## 2022-06-10 DIAGNOSIS — E1165 Type 2 diabetes mellitus with hyperglycemia: Secondary | ICD-10-CM | POA: Diagnosis not present

## 2022-06-12 DIAGNOSIS — I129 Hypertensive chronic kidney disease with stage 1 through stage 4 chronic kidney disease, or unspecified chronic kidney disease: Secondary | ICD-10-CM | POA: Diagnosis not present

## 2022-06-12 DIAGNOSIS — B9561 Methicillin susceptible Staphylococcus aureus infection as the cause of diseases classified elsewhere: Secondary | ICD-10-CM | POA: Diagnosis not present

## 2022-06-12 DIAGNOSIS — E119 Type 2 diabetes mellitus without complications: Secondary | ICD-10-CM | POA: Diagnosis not present

## 2022-06-12 DIAGNOSIS — M14672 Charcot's joint, left ankle and foot: Secondary | ICD-10-CM | POA: Diagnosis not present

## 2022-06-12 DIAGNOSIS — E1152 Type 2 diabetes mellitus with diabetic peripheral angiopathy with gangrene: Secondary | ICD-10-CM | POA: Diagnosis not present

## 2022-06-12 DIAGNOSIS — Z89412 Acquired absence of left great toe: Secondary | ICD-10-CM | POA: Diagnosis not present

## 2022-06-12 DIAGNOSIS — M7989 Other specified soft tissue disorders: Secondary | ICD-10-CM | POA: Diagnosis not present

## 2022-06-12 DIAGNOSIS — E785 Hyperlipidemia, unspecified: Secondary | ICD-10-CM | POA: Diagnosis not present

## 2022-06-12 DIAGNOSIS — Z794 Long term (current) use of insulin: Secondary | ICD-10-CM | POA: Diagnosis not present

## 2022-06-12 DIAGNOSIS — I4891 Unspecified atrial fibrillation: Secondary | ICD-10-CM | POA: Diagnosis not present

## 2022-06-12 DIAGNOSIS — J449 Chronic obstructive pulmonary disease, unspecified: Secondary | ICD-10-CM | POA: Diagnosis not present

## 2022-06-12 DIAGNOSIS — L089 Local infection of the skin and subcutaneous tissue, unspecified: Secondary | ICD-10-CM | POA: Diagnosis not present

## 2022-06-12 DIAGNOSIS — I878 Other specified disorders of veins: Secondary | ICD-10-CM | POA: Diagnosis not present

## 2022-06-12 DIAGNOSIS — E1069 Type 1 diabetes mellitus with other specified complication: Secondary | ICD-10-CM | POA: Diagnosis not present

## 2022-06-12 DIAGNOSIS — M60872 Other myositis, left ankle and foot: Secondary | ICD-10-CM | POA: Diagnosis not present

## 2022-06-12 DIAGNOSIS — M79605 Pain in left leg: Secondary | ICD-10-CM | POA: Diagnosis not present

## 2022-06-12 DIAGNOSIS — E1022 Type 1 diabetes mellitus with diabetic chronic kidney disease: Secondary | ICD-10-CM | POA: Diagnosis not present

## 2022-06-12 DIAGNOSIS — E10610 Type 1 diabetes mellitus with diabetic neuropathic arthropathy: Secondary | ICD-10-CM | POA: Diagnosis not present

## 2022-06-12 DIAGNOSIS — M86172 Other acute osteomyelitis, left ankle and foot: Secondary | ICD-10-CM | POA: Diagnosis not present

## 2022-06-12 DIAGNOSIS — I96 Gangrene, not elsewhere classified: Secondary | ICD-10-CM | POA: Diagnosis not present

## 2022-06-12 DIAGNOSIS — E11621 Type 2 diabetes mellitus with foot ulcer: Secondary | ICD-10-CM | POA: Diagnosis not present

## 2022-06-12 DIAGNOSIS — M609 Myositis, unspecified: Secondary | ICD-10-CM | POA: Diagnosis not present

## 2022-06-12 DIAGNOSIS — Z8631 Personal history of diabetic foot ulcer: Secondary | ICD-10-CM | POA: Diagnosis not present

## 2022-06-12 DIAGNOSIS — E7800 Pure hypercholesterolemia, unspecified: Secondary | ICD-10-CM | POA: Diagnosis not present

## 2022-06-12 DIAGNOSIS — L97522 Non-pressure chronic ulcer of other part of left foot with fat layer exposed: Secondary | ICD-10-CM | POA: Diagnosis not present

## 2022-06-12 DIAGNOSIS — R936 Abnormal findings on diagnostic imaging of limbs: Secondary | ICD-10-CM | POA: Diagnosis not present

## 2022-06-12 DIAGNOSIS — Z87891 Personal history of nicotine dependence: Secondary | ICD-10-CM | POA: Diagnosis not present

## 2022-06-12 DIAGNOSIS — E1042 Type 1 diabetes mellitus with diabetic polyneuropathy: Secondary | ICD-10-CM | POA: Diagnosis not present

## 2022-06-12 DIAGNOSIS — E11610 Type 2 diabetes mellitus with diabetic neuropathic arthropathy: Secondary | ICD-10-CM | POA: Diagnosis not present

## 2022-06-12 DIAGNOSIS — I2510 Atherosclerotic heart disease of native coronary artery without angina pectoris: Secondary | ICD-10-CM | POA: Diagnosis not present

## 2022-06-12 DIAGNOSIS — L0889 Other specified local infections of the skin and subcutaneous tissue: Secondary | ICD-10-CM | POA: Diagnosis not present

## 2022-06-12 DIAGNOSIS — E10621 Type 1 diabetes mellitus with foot ulcer: Secondary | ICD-10-CM | POA: Diagnosis not present

## 2022-06-12 DIAGNOSIS — E11628 Type 2 diabetes mellitus with other skin complications: Secondary | ICD-10-CM | POA: Diagnosis not present

## 2022-06-12 DIAGNOSIS — Z951 Presence of aortocoronary bypass graft: Secondary | ICD-10-CM | POA: Diagnosis not present

## 2022-06-12 DIAGNOSIS — E1142 Type 2 diabetes mellitus with diabetic polyneuropathy: Secondary | ICD-10-CM | POA: Diagnosis not present

## 2022-06-12 DIAGNOSIS — E1043 Type 1 diabetes mellitus with diabetic autonomic (poly)neuropathy: Secondary | ICD-10-CM | POA: Diagnosis not present

## 2022-06-12 DIAGNOSIS — L97529 Non-pressure chronic ulcer of other part of left foot with unspecified severity: Secondary | ICD-10-CM | POA: Diagnosis not present

## 2022-06-12 DIAGNOSIS — L03116 Cellulitis of left lower limb: Secondary | ICD-10-CM | POA: Diagnosis not present

## 2022-06-12 DIAGNOSIS — M87078 Idiopathic aseptic necrosis of left toe(s): Secondary | ICD-10-CM | POA: Diagnosis not present

## 2022-06-12 DIAGNOSIS — M19072 Primary osteoarthritis, left ankle and foot: Secondary | ICD-10-CM | POA: Diagnosis not present

## 2022-06-12 DIAGNOSIS — N183 Chronic kidney disease, stage 3 unspecified: Secondary | ICD-10-CM | POA: Diagnosis not present

## 2022-06-12 DIAGNOSIS — Z7901 Long term (current) use of anticoagulants: Secondary | ICD-10-CM | POA: Diagnosis not present

## 2022-06-12 DIAGNOSIS — I252 Old myocardial infarction: Secondary | ICD-10-CM | POA: Diagnosis not present

## 2022-06-12 DIAGNOSIS — E1122 Type 2 diabetes mellitus with diabetic chronic kidney disease: Secondary | ICD-10-CM | POA: Diagnosis not present

## 2022-06-13 DIAGNOSIS — E1142 Type 2 diabetes mellitus with diabetic polyneuropathy: Secondary | ICD-10-CM | POA: Diagnosis not present

## 2022-06-13 DIAGNOSIS — E11610 Type 2 diabetes mellitus with diabetic neuropathic arthropathy: Secondary | ICD-10-CM | POA: Diagnosis not present

## 2022-06-13 DIAGNOSIS — E11628 Type 2 diabetes mellitus with other skin complications: Secondary | ICD-10-CM | POA: Diagnosis not present

## 2022-06-13 DIAGNOSIS — M79605 Pain in left leg: Secondary | ICD-10-CM | POA: Diagnosis not present

## 2022-06-13 DIAGNOSIS — L089 Local infection of the skin and subcutaneous tissue, unspecified: Secondary | ICD-10-CM | POA: Diagnosis not present

## 2022-06-13 DIAGNOSIS — L03116 Cellulitis of left lower limb: Secondary | ICD-10-CM | POA: Diagnosis not present

## 2022-06-14 DIAGNOSIS — R936 Abnormal findings on diagnostic imaging of limbs: Secondary | ICD-10-CM | POA: Diagnosis not present

## 2022-06-14 DIAGNOSIS — E11628 Type 2 diabetes mellitus with other skin complications: Secondary | ICD-10-CM | POA: Diagnosis not present

## 2022-06-14 DIAGNOSIS — L089 Local infection of the skin and subcutaneous tissue, unspecified: Secondary | ICD-10-CM | POA: Diagnosis not present

## 2022-06-15 DIAGNOSIS — M86172 Other acute osteomyelitis, left ankle and foot: Secondary | ICD-10-CM | POA: Diagnosis not present

## 2022-06-15 DIAGNOSIS — Z89412 Acquired absence of left great toe: Secondary | ICD-10-CM | POA: Diagnosis not present

## 2022-06-15 DIAGNOSIS — E11628 Type 2 diabetes mellitus with other skin complications: Secondary | ICD-10-CM | POA: Diagnosis not present

## 2022-06-15 DIAGNOSIS — M87078 Idiopathic aseptic necrosis of left toe(s): Secondary | ICD-10-CM | POA: Diagnosis not present

## 2022-06-15 DIAGNOSIS — I96 Gangrene, not elsewhere classified: Secondary | ICD-10-CM | POA: Diagnosis not present

## 2022-06-25 DIAGNOSIS — Z89422 Acquired absence of other left toe(s): Secondary | ICD-10-CM

## 2022-06-25 DIAGNOSIS — Z794 Long term (current) use of insulin: Secondary | ICD-10-CM | POA: Diagnosis not present

## 2022-06-25 DIAGNOSIS — E1142 Type 2 diabetes mellitus with diabetic polyneuropathy: Secondary | ICD-10-CM | POA: Diagnosis not present

## 2022-06-25 HISTORY — DX: Acquired absence of other left toe(s): Z89.422

## 2022-06-26 DIAGNOSIS — E113393 Type 2 diabetes mellitus with moderate nonproliferative diabetic retinopathy without macular edema, bilateral: Secondary | ICD-10-CM | POA: Diagnosis not present

## 2022-07-02 DIAGNOSIS — Z89422 Acquired absence of other left toe(s): Secondary | ICD-10-CM | POA: Diagnosis not present

## 2022-07-11 DIAGNOSIS — E1165 Type 2 diabetes mellitus with hyperglycemia: Secondary | ICD-10-CM | POA: Diagnosis not present

## 2022-07-16 DIAGNOSIS — Z89422 Acquired absence of other left toe(s): Secondary | ICD-10-CM | POA: Diagnosis not present

## 2022-07-16 DIAGNOSIS — Z794 Long term (current) use of insulin: Secondary | ICD-10-CM | POA: Diagnosis not present

## 2022-07-16 DIAGNOSIS — E1142 Type 2 diabetes mellitus with diabetic polyneuropathy: Secondary | ICD-10-CM | POA: Diagnosis not present

## 2022-08-02 DIAGNOSIS — E785 Hyperlipidemia, unspecified: Secondary | ICD-10-CM | POA: Diagnosis not present

## 2022-08-02 DIAGNOSIS — E11649 Type 2 diabetes mellitus with hypoglycemia without coma: Secondary | ICD-10-CM | POA: Diagnosis not present

## 2022-08-02 DIAGNOSIS — E1142 Type 2 diabetes mellitus with diabetic polyneuropathy: Secondary | ICD-10-CM | POA: Diagnosis not present

## 2022-08-02 DIAGNOSIS — Z89422 Acquired absence of other left toe(s): Secondary | ICD-10-CM | POA: Diagnosis not present

## 2022-08-02 DIAGNOSIS — I10 Essential (primary) hypertension: Secondary | ICD-10-CM | POA: Diagnosis not present

## 2022-08-02 DIAGNOSIS — Z794 Long term (current) use of insulin: Secondary | ICD-10-CM | POA: Diagnosis not present

## 2022-08-02 DIAGNOSIS — E11610 Type 2 diabetes mellitus with diabetic neuropathic arthropathy: Secondary | ICD-10-CM | POA: Diagnosis not present

## 2022-08-02 DIAGNOSIS — Z87891 Personal history of nicotine dependence: Secondary | ICD-10-CM | POA: Diagnosis not present

## 2022-08-02 DIAGNOSIS — E041 Nontoxic single thyroid nodule: Secondary | ICD-10-CM | POA: Diagnosis not present

## 2022-08-10 DIAGNOSIS — E1165 Type 2 diabetes mellitus with hyperglycemia: Secondary | ICD-10-CM | POA: Diagnosis not present

## 2022-08-13 DIAGNOSIS — E041 Nontoxic single thyroid nodule: Secondary | ICD-10-CM | POA: Diagnosis not present

## 2022-08-27 DIAGNOSIS — Z89412 Acquired absence of left great toe: Secondary | ICD-10-CM | POA: Diagnosis not present

## 2022-08-27 DIAGNOSIS — Z89422 Acquired absence of other left toe(s): Secondary | ICD-10-CM | POA: Diagnosis not present

## 2022-08-27 DIAGNOSIS — Z794 Long term (current) use of insulin: Secondary | ICD-10-CM | POA: Diagnosis not present

## 2022-08-27 DIAGNOSIS — E11610 Type 2 diabetes mellitus with diabetic neuropathic arthropathy: Secondary | ICD-10-CM | POA: Diagnosis not present

## 2022-08-27 DIAGNOSIS — E1142 Type 2 diabetes mellitus with diabetic polyneuropathy: Secondary | ICD-10-CM | POA: Diagnosis not present

## 2022-09-09 DIAGNOSIS — E11610 Type 2 diabetes mellitus with diabetic neuropathic arthropathy: Secondary | ICD-10-CM | POA: Diagnosis not present

## 2022-09-09 DIAGNOSIS — Z8631 Personal history of diabetic foot ulcer: Secondary | ICD-10-CM | POA: Diagnosis not present

## 2022-09-09 DIAGNOSIS — E1142 Type 2 diabetes mellitus with diabetic polyneuropathy: Secondary | ICD-10-CM | POA: Diagnosis not present

## 2022-09-09 DIAGNOSIS — Z89422 Acquired absence of other left toe(s): Secondary | ICD-10-CM | POA: Diagnosis not present

## 2022-09-09 DIAGNOSIS — Z89412 Acquired absence of left great toe: Secondary | ICD-10-CM | POA: Diagnosis not present

## 2022-09-09 DIAGNOSIS — Z794 Long term (current) use of insulin: Secondary | ICD-10-CM | POA: Diagnosis not present

## 2022-09-10 DIAGNOSIS — E1165 Type 2 diabetes mellitus with hyperglycemia: Secondary | ICD-10-CM | POA: Diagnosis not present

## 2022-09-16 ENCOUNTER — Telehealth: Payer: Self-pay

## 2022-09-16 NOTE — Patient Outreach (Signed)
  Care Coordination   09/16/2022 Name: Barbara Keng MRN: [id number] DOB: Jan 29, 1951   Care Coordination Outreach Attempts:  A second unsuccessful outreach was attempted today to offer the patient with information about available care coordination services as a benefit of their health plan.     Follow Up Plan:  Additional outreach attempts will be made to offer the patient care coordination information and services.   Encounter Outcome:  No Answer   Care Coordination Interventions:  No, not indicated    Tomasa Rand, RN, BSN, [facility] [employer] [phone]

## 2022-09-17 ENCOUNTER — Telehealth: Payer: Self-pay

## 2022-09-17 NOTE — Patient Outreach (Signed)
  Care Coordination   09/17/2022 Name: Joseph [name] MRN: [id number] DOB: 1951-04-13   Care Coordination Outreach Attempts:  A third unsuccessful outreach was attempted today to offer the patient with information about available care coordination services as a benefit of their health plan.   Follow Up Plan:  No further outreach attempts will be made at this time. We have been unable to contact the patient to offer or enroll patient in care coordination services  Encounter Outcome:  No Answer   Care Coordination Interventions:  No, not indicated    Tomasa Rand, RN, BSN, [facility] [place] Coordinator [phone]

## 2022-10-10 DIAGNOSIS — E1165 Type 2 diabetes mellitus with hyperglycemia: Secondary | ICD-10-CM | POA: Diagnosis not present

## 2022-10-16 DIAGNOSIS — J349 Unspecified disorder of nose and nasal sinuses: Secondary | ICD-10-CM | POA: Diagnosis not present

## 2022-10-16 DIAGNOSIS — J069 Acute upper respiratory infection, unspecified: Secondary | ICD-10-CM | POA: Diagnosis not present

## 2022-11-09 ENCOUNTER — Other Ambulatory Visit: Payer: Self-pay | Admitting: Cardiology

## 2022-11-10 DIAGNOSIS — E1165 Type 2 diabetes mellitus with hyperglycemia: Secondary | ICD-10-CM | POA: Diagnosis not present

## 2022-12-11 DIAGNOSIS — E1165 Type 2 diabetes mellitus with hyperglycemia: Secondary | ICD-10-CM | POA: Diagnosis not present

## 2022-12-13 DIAGNOSIS — Z87891 Personal history of nicotine dependence: Secondary | ICD-10-CM | POA: Diagnosis not present

## 2022-12-13 DIAGNOSIS — Z951 Presence of aortocoronary bypass graft: Secondary | ICD-10-CM | POA: Diagnosis not present

## 2022-12-13 DIAGNOSIS — Z89422 Acquired absence of other left toe(s): Secondary | ICD-10-CM | POA: Diagnosis not present

## 2022-12-13 DIAGNOSIS — E785 Hyperlipidemia, unspecified: Secondary | ICD-10-CM | POA: Diagnosis not present

## 2022-12-13 DIAGNOSIS — E041 Nontoxic single thyroid nodule: Secondary | ICD-10-CM | POA: Diagnosis not present

## 2022-12-13 DIAGNOSIS — E11610 Type 2 diabetes mellitus with diabetic neuropathic arthropathy: Secondary | ICD-10-CM | POA: Diagnosis not present

## 2022-12-13 DIAGNOSIS — E1140 Type 2 diabetes mellitus with diabetic neuropathy, unspecified: Secondary | ICD-10-CM | POA: Diagnosis not present

## 2022-12-13 DIAGNOSIS — I10 Essential (primary) hypertension: Secondary | ICD-10-CM | POA: Diagnosis not present

## 2022-12-13 DIAGNOSIS — Z794 Long term (current) use of insulin: Secondary | ICD-10-CM | POA: Diagnosis not present

## 2022-12-17 ENCOUNTER — Other Ambulatory Visit: Payer: Self-pay | Admitting: Cardiology

## 2022-12-23 DIAGNOSIS — Z8631 Personal history of diabetic foot ulcer: Secondary | ICD-10-CM | POA: Diagnosis not present

## 2022-12-23 DIAGNOSIS — Z89412 Acquired absence of left great toe: Secondary | ICD-10-CM | POA: Diagnosis not present

## 2022-12-23 DIAGNOSIS — E11610 Type 2 diabetes mellitus with diabetic neuropathic arthropathy: Secondary | ICD-10-CM | POA: Diagnosis not present

## 2022-12-23 DIAGNOSIS — Z794 Long term (current) use of insulin: Secondary | ICD-10-CM | POA: Diagnosis not present

## 2022-12-23 DIAGNOSIS — Z89422 Acquired absence of other left toe(s): Secondary | ICD-10-CM | POA: Diagnosis not present

## 2023-01-09 DIAGNOSIS — E1165 Type 2 diabetes mellitus with hyperglycemia: Secondary | ICD-10-CM | POA: Diagnosis not present

## 2023-02-09 DIAGNOSIS — E1165 Type 2 diabetes mellitus with hyperglycemia: Secondary | ICD-10-CM | POA: Diagnosis not present

## 2023-03-11 DIAGNOSIS — E1165 Type 2 diabetes mellitus with hyperglycemia: Secondary | ICD-10-CM | POA: Diagnosis not present

## 2023-03-31 ENCOUNTER — Other Ambulatory Visit: Payer: Self-pay | Admitting: Cardiology

## 2023-03-31 DIAGNOSIS — E11610 Type 2 diabetes mellitus with diabetic neuropathic arthropathy: Secondary | ICD-10-CM | POA: Diagnosis not present

## 2023-04-07 DIAGNOSIS — Z89422 Acquired absence of other left toe(s): Secondary | ICD-10-CM | POA: Diagnosis not present

## 2023-04-07 DIAGNOSIS — E119 Type 2 diabetes mellitus without complications: Secondary | ICD-10-CM | POA: Diagnosis not present

## 2023-04-07 DIAGNOSIS — Z89412 Acquired absence of left great toe: Secondary | ICD-10-CM | POA: Diagnosis not present

## 2023-04-07 DIAGNOSIS — E11610 Type 2 diabetes mellitus with diabetic neuropathic arthropathy: Secondary | ICD-10-CM | POA: Diagnosis not present

## 2023-04-07 DIAGNOSIS — Z8631 Personal history of diabetic foot ulcer: Secondary | ICD-10-CM | POA: Diagnosis not present

## 2023-04-11 DIAGNOSIS — E1165 Type 2 diabetes mellitus with hyperglycemia: Secondary | ICD-10-CM | POA: Diagnosis not present

## 2023-04-13 ENCOUNTER — Other Ambulatory Visit: Payer: Self-pay | Admitting: Cardiology

## 2023-04-13 DIAGNOSIS — I4891 Unspecified atrial fibrillation: Secondary | ICD-10-CM

## 2023-04-14 NOTE — Telephone Encounter (Signed)
Prescription refill request for Eliquis received. Indication: Afib  Last office visit:  05/20/22 (Revankar)  Scr:1.08 (06/17/22) Age: 72 Weight: 88kg  Appropriate dose. Refill sent.

## 2023-04-22 DIAGNOSIS — E1165 Type 2 diabetes mellitus with hyperglycemia: Secondary | ICD-10-CM | POA: Diagnosis not present

## 2023-04-22 DIAGNOSIS — E11610 Type 2 diabetes mellitus with diabetic neuropathic arthropathy: Secondary | ICD-10-CM | POA: Diagnosis not present

## 2023-04-22 DIAGNOSIS — Z794 Long term (current) use of insulin: Secondary | ICD-10-CM | POA: Diagnosis not present

## 2023-04-22 DIAGNOSIS — E041 Nontoxic single thyroid nodule: Secondary | ICD-10-CM | POA: Diagnosis not present

## 2023-04-22 DIAGNOSIS — I152 Hypertension secondary to endocrine disorders: Secondary | ICD-10-CM | POA: Diagnosis not present

## 2023-04-22 DIAGNOSIS — E785 Hyperlipidemia, unspecified: Secondary | ICD-10-CM | POA: Diagnosis not present

## 2023-04-23 ENCOUNTER — Ambulatory Visit: Payer: PPO | Admitting: Cardiology

## 2023-05-10 ENCOUNTER — Other Ambulatory Visit: Payer: Self-pay | Admitting: Cardiology

## 2023-05-10 DIAGNOSIS — E782 Mixed hyperlipidemia: Secondary | ICD-10-CM

## 2023-05-10 DIAGNOSIS — I2510 Atherosclerotic heart disease of native coronary artery without angina pectoris: Secondary | ICD-10-CM

## 2023-05-11 DIAGNOSIS — E1165 Type 2 diabetes mellitus with hyperglycemia: Secondary | ICD-10-CM | POA: Diagnosis not present

## 2023-05-26 ENCOUNTER — Other Ambulatory Visit: Payer: Self-pay | Admitting: Cardiology

## 2023-06-11 DIAGNOSIS — E1165 Type 2 diabetes mellitus with hyperglycemia: Secondary | ICD-10-CM | POA: Diagnosis not present

## 2023-06-18 ENCOUNTER — Ambulatory Visit: Payer: PPO | Attending: Cardiology | Admitting: Cardiology

## 2023-06-18 ENCOUNTER — Encounter: Payer: Self-pay | Admitting: Cardiology

## 2023-06-18 VITALS — BP 108/60 | HR 67 | Ht 70.6 in | Wt 185.2 lb

## 2023-06-18 DIAGNOSIS — Z9889 Other specified postprocedural states: Secondary | ICD-10-CM | POA: Diagnosis not present

## 2023-06-18 DIAGNOSIS — E782 Mixed hyperlipidemia: Secondary | ICD-10-CM | POA: Diagnosis not present

## 2023-06-18 DIAGNOSIS — I10 Essential (primary) hypertension: Secondary | ICD-10-CM

## 2023-06-18 DIAGNOSIS — L97422 Non-pressure chronic ulcer of left heel and midfoot with fat layer exposed: Secondary | ICD-10-CM

## 2023-06-18 DIAGNOSIS — Z794 Long term (current) use of insulin: Secondary | ICD-10-CM | POA: Diagnosis not present

## 2023-06-18 DIAGNOSIS — I4891 Unspecified atrial fibrillation: Secondary | ICD-10-CM | POA: Diagnosis not present

## 2023-06-18 DIAGNOSIS — I2510 Atherosclerotic heart disease of native coronary artery without angina pectoris: Secondary | ICD-10-CM

## 2023-06-18 DIAGNOSIS — E559 Vitamin D deficiency, unspecified: Secondary | ICD-10-CM

## 2023-06-18 DIAGNOSIS — Z8679 Personal history of other diseases of the circulatory system: Secondary | ICD-10-CM

## 2023-06-18 DIAGNOSIS — E08621 Diabetes mellitus due to underlying condition with foot ulcer: Secondary | ICD-10-CM

## 2023-06-18 MED ORDER — ATORVASTATIN CALCIUM 40 MG PO TABS
40.0000 mg | ORAL_TABLET | Freq: Every day | ORAL | 3 refills | Status: DC
Start: 2023-06-18 — End: 2024-06-09

## 2023-06-18 NOTE — Progress Notes (Signed)
Cardiology Office Note:    Date:  06/18/2023   ID:  Joseph [name], DOB 10-17-1950, MRN [id number]  PCP:  Noni Saupe, MD  Cardiologist:  Garwin Brothers, MD   Referring MD: Noni Saupe, MD    ASSESSMENT:    1. Coronary artery disease involving native coronary artery of native heart without angina pectoris   2. Mixed hyperlipidemia   3. Benign essential hypertension   4. Atrial fibrillation, unspecified type (HCC)   5. Diabetic ulcer of left midfoot associated with diabetes mellitus due to underlying condition, with fat layer exposed (HCC)   6. S/P ablation of atrial fibrillation    PLAN:    In order of problems listed above:  Coronary artery disease: Secondary prevention stressed to the patient.  Importance of compliance with diet medication stressed any vocalized understanding.  He was advised to walk or use a exercise bicycle on a regular basis and he promises to do so. Cardiomyopathy: Reversal of ejection fraction to normal and is happy about it.  He will continue guideline directed medical therapy. Essential hypertension: Blood pressure stable and diet was emphasized. Paroxysmal atrial fibrillation:I discussed with the patient atrial fibrillation, disease process. Management and therapy including rate and rhythm control, anticoagulation benefits and potential risks were discussed extensively with the patient. Patient had multiple questions which were answered to patient's satisfaction. Diabetes mellitus and mixed dyslipidemia: Lifestyle modification urged he will be back in the next few days for complete blood work and will check lipids and A1c also.  Diet emphasized. Patient will be seen in follow-up appointment in 6 months or earlier if the patient has any concerns.    Medication Adjustments/Labs and Tests Ordered: Current medicines are reviewed at length with the patient today.  Concerns regarding medicines are outlined above.  Orders Placed This Encounter   Procedures   EKG 12-Lead   No orders of the defined types were placed in this encounter.    No chief complaint on file.    History of Present Illness:    Joseph [name] is a 72 y.o. male.  Patient has past medical history of paroxysmal fibrillation, coronary artery disease, essential hypertension, mixed dyslipidemia, diabetes mellitus, history of moderate mitral regurgitation and cardiomyopathy with normalization of ejection fraction.  He denies any problems at this time and takes care of activities of daily living.  No chest pain orthopnea or PND.  He leads a sedentary lifestyle.  At the time of my evaluation, the patient is alert awake oriented and in no distress.  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) 06/25/2022   Aftercare following surgery 06/05/2020   Atrial fibrillation (HCC)    Benign essential hypertension 01/09/2016   CAD (coronary artery disease)    Cardiomyopathy (HCC) 11/28/2021   Coronary artery disease 05/10/2015   Diabetes mellitus (HCC)    Diabetic Charcot foot (HCC) 01/09/2016   Diabetic infection of left foot (HCC) 05/17/2020   Diabetic peripheral neuropathy (HCC) 11/27/2019   Diabetic ulcer of left midfoot associated with diabetes mellitus due to underlying condition, with fat layer exposed (HCC) 09/10/2017   Diabetic ulcer of right midfoot associated with type 2 diabetes mellitus, with fat layer exposed (HCC) 12/10/2019   Essential hypertension    Hammertoes of both feet 11/18/2018   History of diabetic ulcer of foot 09/03/2021   Hyperlipidemia 01/09/2016   Infected blister of toe of right foot 01/05/2018   Insulin long-term use (HCC) 01/09/2016  Moderate mitral regurgitation 03/25/2018   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   Pre-ulcerative calluses 07/08/2018   Pure hypercholesterolemia    S/P ablation of atrial fibrillation 10/08/2018   Status post amputation of left great toe (HCC) 01/31/2016   Thyroid nodule  04/10/2016   Toenail fungus 11/17/2017   Type 2 diabetes, uncontrolled, with neuropathy 05/01/2016    Past Surgical History:  Procedure Laterality Date   ABLATION OF DYSRHYTHMIC FOCUS     ATRIAL FIBRILLATION ABLATION N/A 09/24/2018   Procedure: ATRIAL FIBRILLATION ABLATION;  Surgeon: Regan Lemming, MD;  Location: [facility];  Service: Cardiovascular;  Laterality: N/A;   big toe removed     cardiac catheterization     CORONARY STENT PLACEMENT     tonsillectomy      Current Medications: Current Meds  Medication Sig   acetaminophen (TYLENOL) 500 MG tablet Take 500 mg by mouth every 4 (four) hours as needed for headache.   apixaban (ELIQUIS) 5 MG TABS tablet Take 1 tablet by mouth twice daily   aspirin EC 81 MG tablet Take 81 mg by mouth daily.   atorvastatin (LIPITOR) 40 MG tablet Take 1 tablet (40 mg total) by mouth daily.   Cyanocobalamin 2000 MCG TBCR Take 1,000 mcg by mouth 2 (two) times daily.   ENTRESTO 49-51 MG Take 1 tablet by mouth twice daily   gabapentin (NEURONTIN) 300 MG capsule Take 300 mg by mouth at bedtime.    Glucagon 3 MG/DOSE POWD Place 1 spray into the nose as needed for low blood sugar.   LEVEMIR 100 UNIT/ML injection Inject 11 Units into the skin at bedtime.   metoprolol succinate (TOPROL-XL) 100 MG 24 hr tablet Take 1 tablet (100 mg total) by mouth daily.   NOVOLOG FLEXPEN RELION 100 UNIT/ML FlexPen Inject 12-15 Units into the skin with breakfast, with lunch, and with evening meal. Based on sliding scale     Allergies:   Patient has no known allergies.   Social History   Socioeconomic History   Marital status: Unknown    Spouse name: Not on file   Number of children: Not on file   Years of education: Not on file   Highest education level: Not on file  Occupational History   Not on file  Tobacco Use   Smoking status: Former   Smokeless tobacco: Never  Vaping Use   Vaping status: Never Used  Substance and Sexual Activity   Alcohol  use: Yes   Drug use: No   Sexual activity: Not on file  Other Topics Concern   Not on file  Social History Narrative   Not on file   Social Determinants of Health   Financial Resource Strain: Not on file  Food Insecurity: Low Risk  (04/22/2023)   Received from [facility]   Hunger Vital Sign    Worried About Running Out of Food in the Last Year: Never true    Ran Out of Food in the Last Year: Never true  Transportation Needs: Not on file (04/22/2023)  Physical Activity: Not on file  Stress: Not on file  Social Connections: Unknown (02/26/2022)   Received from [facility]   Social Network    Social Network: Not on file     Family History: The patient's family history includes Cancer in his father; Heart disease in his mother.  ROS:   Please see the history of present illness.    All other systems reviewed and are negative.  EKGs/Labs/Other Studies Reviewed:  The following studies were reviewed today: I discussed my findings with the patient at length.   Recent Labs: No results found for requested labs within last 365 days.  Recent Lipid Panel    Component Value Date/Time   CHOL 158 11/15/2021 0803   TRIG 101 11/15/2021 0803   HDL 58 11/15/2021 0803   CHOLHDL 2.7 11/15/2021 0803   LDLCALC 82 11/15/2021 0803    Physical Exam:    VS:  BP 108/60   Pulse 67   Ht 5' 10.6" (1.793 m)   Wt 185 lb 3.2 oz (84 kg)   SpO2 96%   BMI 26.12 kg/m     Wt Readings from Last 3 Encounters:  06/18/23 185 lb 3.2 oz (84 kg)  05/20/22 194 lb (88 kg)  03/18/22 193 lb 6.4 oz (87.7 kg)     GEN: Patient is in no acute distress HEENT: Normal NECK: No JVD; No carotid bruits LYMPHATICS: No lymphadenopathy CARDIAC: Hear sounds regular, 2/6 systolic murmur at the apex. RESPIRATORY:  Clear to auscultation without rales, wheezing or rhonchi  ABDOMEN: Soft, non-tender, non-distended MUSCULOSKELETAL:  No edema; No deformity  SKIN: Warm and dry NEUROLOGIC:  Alert and oriented x  3 PSYCHIATRIC:  Normal affect   Signed, Garwin Brothers, MD  06/18/2023 3:19 PM    [facility]

## 2023-06-18 NOTE — Patient Instructions (Signed)
Medication Instructions:  Your physician recommends that you continue on your current medications as directed. Please refer to the Current Medication list given to you today.  *If you need a refill on your cardiac medications before your next appointment, please call your pharmacy*   Lab Work: Your physician recommends that you return for lab work in: the next few days. You need to have labs done when you are fasting.  You can come Monday through Friday 8:30 am to 12:00 pm and 1:15 to 4:30. You do not need to make an appointment as the order has already been placed. The labs you are going to have done are CMP, CBC, TSH, vitamin D, A1C and Lipids.  If you have labs (blood work) drawn today and your tests are completely normal, you will receive your results only by: MyChart Message (if you have MyChart) OR A paper copy in the mail If you have any lab test that is abnormal or we need to change your treatment, we will call you to review the results.   Testing/Procedures: None ordered   Follow-Up: At [facility], you and your health needs are our priority.  As part of our continuing mission to provide you with exceptional heart care, we have created designated Provider Care Teams.  These Care Teams include your primary Cardiologist (physician) and Advanced Practice Providers (APPs -  Physician Assistants and Nurse Practitioners) who all work together to provide you with the care you need, when you need it.  We recommend signing up for the patient portal called "MyChart".  Sign up information is provided on this After Visit Summary.  MyChart is used to connect with patients for Virtual Visits (Telemedicine).  Patients are able to view lab/test results, encounter notes, upcoming appointments, etc.  Non-urgent messages can be sent to your provider as well.   To learn more about what you can do with MyChart, go to [email or web address].    Your next appointment:   9 month(s)  The  format for your next appointment:   In Person  Provider:   Belva Crome, MD    Other Instructions none  Important Information About Sugar

## 2023-06-19 DIAGNOSIS — I10 Essential (primary) hypertension: Secondary | ICD-10-CM | POA: Diagnosis not present

## 2023-06-19 DIAGNOSIS — I2510 Atherosclerotic heart disease of native coronary artery without angina pectoris: Secondary | ICD-10-CM | POA: Diagnosis not present

## 2023-06-19 DIAGNOSIS — E559 Vitamin D deficiency, unspecified: Secondary | ICD-10-CM | POA: Diagnosis not present

## 2023-06-19 DIAGNOSIS — L97422 Non-pressure chronic ulcer of left heel and midfoot with fat layer exposed: Secondary | ICD-10-CM | POA: Diagnosis not present

## 2023-06-19 DIAGNOSIS — E08621 Diabetes mellitus due to underlying condition with foot ulcer: Secondary | ICD-10-CM | POA: Diagnosis not present

## 2023-06-20 ENCOUNTER — Telehealth: Payer: Self-pay

## 2023-06-20 LAB — COMPREHENSIVE METABOLIC PANEL
ALT: 15 IU/L (ref 0–44)
AST: 15 IU/L (ref 0–40)
Albumin: 3.6 g/dL — ABNORMAL LOW (ref 3.8–4.8)
Alkaline Phosphatase: 129 IU/L — ABNORMAL HIGH (ref 44–121)
BUN/Creatinine Ratio: 17 (ref 10–24)
BUN: 20 mg/dL (ref 8–27)
Bilirubin Total: 0.6 mg/dL (ref 0.0–1.2)
CO2: 26 mmol/L (ref 20–29)
Calcium: 8.8 mg/dL (ref 8.6–10.2)
Chloride: 104 mmol/L (ref 96–106)
Creatinine, Ser: 1.18 mg/dL (ref 0.76–1.27)
Globulin, Total: 2 g/dL (ref 1.5–4.5)
Glucose: 249 mg/dL — ABNORMAL HIGH (ref 70–99)
Potassium: 5 mmol/L (ref 3.5–5.2)
Sodium: 141 mmol/L (ref 134–144)
Total Protein: 5.6 g/dL — ABNORMAL LOW (ref 6.0–8.5)
eGFR: 66 mL/min/{1.73_m2} (ref >59)

## 2023-06-20 LAB — CBC
Hematocrit: 44.4 % (ref 37.5–51.0)
Hemoglobin: 14.1 g/dL (ref 13.0–17.7)
MCH: 30.1 pg (ref 26.6–33.0)
MCHC: 31.8 g/dL (ref 31.5–35.7)
MCV: 95 fL (ref 79–97)
Platelets: 133 10*3/uL — ABNORMAL LOW (ref 150–450)
RBC: 4.68 x10E6/uL (ref 4.14–5.80)
RDW: 12.4 % (ref 11.6–15.4)
WBC: 4.6 10*3/uL (ref 3.4–10.8)

## 2023-06-20 LAB — LIPID PANEL
Chol/HDL Ratio: 2.7 ratio (ref 0.0–5.0)
Cholesterol, Total: 134 mg/dL (ref 100–199)
HDL: 49 mg/dL (ref >39)
LDL Chol Calc (NIH): 66 mg/dL (ref 0–99)
Triglycerides: 100 mg/dL (ref 0–149)
VLDL Cholesterol Cal: 19 mg/dL (ref 5–40)

## 2023-06-20 LAB — TSH: TSH: 2.56 u[IU]/mL (ref 0.450–4.500)

## 2023-06-20 LAB — HEMOGLOBIN A1C
Est. average glucose Bld gHb Est-mCnc: 194 mg/dL
Hgb A1c MFr Bld: 8.4 % — ABNORMAL HIGH (ref 4.8–5.6)

## 2023-06-20 LAB — VITAMIN D 25 HYDROXY (VIT D DEFICIENCY, FRACTURES): Vit D, 25-Hydroxy: 28.3 ng/mL — ABNORMAL LOW (ref 30.0–100.0)

## 2023-06-20 NOTE — Telephone Encounter (Signed)
-----   Message from Brooks R Revankar sent at 06/20/2023 12:43 PM EDT ----- A1c is markedly elevated.The results of the study is unremarkable. Please inform patient. I will discuss in detail at next appointment. Cc  primary care/referring physician Garwin Brothers, MD 06/20/2023 12:42 PM

## 2023-06-20 NOTE — Telephone Encounter (Signed)
Left vm to call back

## 2023-06-25 DIAGNOSIS — L03115 Cellulitis of right lower limb: Secondary | ICD-10-CM | POA: Diagnosis not present

## 2023-06-25 DIAGNOSIS — E1142 Type 2 diabetes mellitus with diabetic polyneuropathy: Secondary | ICD-10-CM | POA: Diagnosis not present

## 2023-06-25 DIAGNOSIS — Z89422 Acquired absence of other left toe(s): Secondary | ICD-10-CM | POA: Diagnosis not present

## 2023-06-25 DIAGNOSIS — Z89412 Acquired absence of left great toe: Secondary | ICD-10-CM | POA: Diagnosis not present

## 2023-06-25 DIAGNOSIS — E11610 Type 2 diabetes mellitus with diabetic neuropathic arthropathy: Secondary | ICD-10-CM | POA: Diagnosis not present

## 2023-06-28 ENCOUNTER — Other Ambulatory Visit: Payer: Self-pay | Admitting: Cardiology

## 2023-06-29 ENCOUNTER — Other Ambulatory Visit: Payer: Self-pay | Admitting: Cardiology

## 2023-07-04 DIAGNOSIS — H9202 Otalgia, left ear: Secondary | ICD-10-CM | POA: Diagnosis not present

## 2023-07-05 DIAGNOSIS — H6122 Impacted cerumen, left ear: Secondary | ICD-10-CM | POA: Diagnosis not present

## 2023-07-07 ENCOUNTER — Other Ambulatory Visit: Payer: Self-pay | Admitting: Cardiology

## 2023-07-07 DIAGNOSIS — I4891 Unspecified atrial fibrillation: Secondary | ICD-10-CM

## 2023-07-07 NOTE — Telephone Encounter (Signed)
Prescription refill request for Eliquis received. Indication:afib Last office visit:9/24 Scr:1.18  9/24 Age: 72 Weight:84  kg  Prescription refilled

## 2023-07-09 DIAGNOSIS — Z89422 Acquired absence of other left toe(s): Secondary | ICD-10-CM | POA: Diagnosis not present

## 2023-07-09 DIAGNOSIS — E1142 Type 2 diabetes mellitus with diabetic polyneuropathy: Secondary | ICD-10-CM | POA: Diagnosis not present

## 2023-07-09 DIAGNOSIS — Z89412 Acquired absence of left great toe: Secondary | ICD-10-CM | POA: Diagnosis not present

## 2023-07-09 DIAGNOSIS — Z8631 Personal history of diabetic foot ulcer: Secondary | ICD-10-CM | POA: Diagnosis not present

## 2023-07-09 DIAGNOSIS — E11610 Type 2 diabetes mellitus with diabetic neuropathic arthropathy: Secondary | ICD-10-CM | POA: Diagnosis not present

## 2023-07-11 DIAGNOSIS — H6692 Otitis media, unspecified, left ear: Secondary | ICD-10-CM | POA: Diagnosis not present

## 2023-07-11 DIAGNOSIS — H9202 Otalgia, left ear: Secondary | ICD-10-CM | POA: Diagnosis not present

## 2023-07-12 DIAGNOSIS — E1165 Type 2 diabetes mellitus with hyperglycemia: Secondary | ICD-10-CM | POA: Diagnosis not present

## 2023-07-30 ENCOUNTER — Other Ambulatory Visit: Payer: Self-pay | Admitting: Cardiology

## 2023-07-30 DIAGNOSIS — Z89422 Acquired absence of other left toe(s): Secondary | ICD-10-CM | POA: Diagnosis not present

## 2023-07-30 DIAGNOSIS — Z89412 Acquired absence of left great toe: Secondary | ICD-10-CM | POA: Diagnosis not present

## 2023-07-30 DIAGNOSIS — E1142 Type 2 diabetes mellitus with diabetic polyneuropathy: Secondary | ICD-10-CM | POA: Diagnosis not present

## 2023-07-30 DIAGNOSIS — E11610 Type 2 diabetes mellitus with diabetic neuropathic arthropathy: Secondary | ICD-10-CM | POA: Diagnosis not present

## 2023-07-30 DIAGNOSIS — Z8631 Personal history of diabetic foot ulcer: Secondary | ICD-10-CM | POA: Diagnosis not present

## 2023-08-11 DIAGNOSIS — E1165 Type 2 diabetes mellitus with hyperglycemia: Secondary | ICD-10-CM | POA: Diagnosis not present

## 2023-09-10 DIAGNOSIS — E1165 Type 2 diabetes mellitus with hyperglycemia: Secondary | ICD-10-CM | POA: Diagnosis not present

## 2023-09-10 DIAGNOSIS — Z978 Presence of other specified devices: Secondary | ICD-10-CM | POA: Diagnosis not present

## 2023-09-10 DIAGNOSIS — I10 Essential (primary) hypertension: Secondary | ICD-10-CM | POA: Diagnosis not present

## 2023-09-10 DIAGNOSIS — E785 Hyperlipidemia, unspecified: Secondary | ICD-10-CM | POA: Diagnosis not present

## 2023-09-10 DIAGNOSIS — E041 Nontoxic single thyroid nodule: Secondary | ICD-10-CM | POA: Diagnosis not present

## 2023-09-10 DIAGNOSIS — Z794 Long term (current) use of insulin: Secondary | ICD-10-CM | POA: Diagnosis not present

## 2023-09-10 DIAGNOSIS — E1142 Type 2 diabetes mellitus with diabetic polyneuropathy: Secondary | ICD-10-CM | POA: Diagnosis not present

## 2023-09-11 DIAGNOSIS — E1165 Type 2 diabetes mellitus with hyperglycemia: Secondary | ICD-10-CM | POA: Diagnosis not present

## 2023-10-01 DIAGNOSIS — S90424A Blister (nonthermal), right lesser toe(s), initial encounter: Secondary | ICD-10-CM | POA: Diagnosis not present

## 2023-10-01 DIAGNOSIS — Z89422 Acquired absence of other left toe(s): Secondary | ICD-10-CM | POA: Diagnosis not present

## 2023-10-01 DIAGNOSIS — E11610 Type 2 diabetes mellitus with diabetic neuropathic arthropathy: Secondary | ICD-10-CM | POA: Diagnosis not present

## 2023-10-01 DIAGNOSIS — E1165 Type 2 diabetes mellitus with hyperglycemia: Secondary | ICD-10-CM | POA: Diagnosis not present

## 2023-10-01 DIAGNOSIS — Z89412 Acquired absence of left great toe: Secondary | ICD-10-CM | POA: Diagnosis not present

## 2023-10-01 DIAGNOSIS — S90421A Blister (nonthermal), right great toe, initial encounter: Secondary | ICD-10-CM | POA: Diagnosis not present

## 2023-10-01 DIAGNOSIS — E1142 Type 2 diabetes mellitus with diabetic polyneuropathy: Secondary | ICD-10-CM | POA: Diagnosis not present

## 2023-10-05 ENCOUNTER — Other Ambulatory Visit: Payer: Self-pay | Admitting: Cardiology

## 2023-10-06 NOTE — Telephone Encounter (Signed)
Medication sent using previous instructions:  metoprolol succinate (TOPROL-XL) 100 MG 24 hr tablet Medication Date: 06/30/2023 Department: [facility] [place] Ordering/Authorizing: Revankar, Aundra Dubin, MD   Order Providers  Prescribing Provider Encounter Provider  Revankar, Aundra Dubin, MD Revankar, Aundra Dubin, MD   Outpatient Medication Detail   Disp Refills Start End   metoprolol succinate (TOPROL-XL) 100 MG 24 hr tablet 90 tablet 0 06/30/2023 --   Sig - Route: Take 1 tablet by mouth once daily - Oral   Sent to pharmacy as: metoprolol succinate (TOPROL-XL) 100 MG 24 hr tablet   E-Prescribing Status: Receipt confirmed by pharmacy (06/30/2023 11:23 AM EDT)    Pharmacy  [facility] - [place], [place] - [address]

## 2023-10-10 DIAGNOSIS — Z8631 Personal history of diabetic foot ulcer: Secondary | ICD-10-CM | POA: Diagnosis not present

## 2023-10-10 DIAGNOSIS — Z89412 Acquired absence of left great toe: Secondary | ICD-10-CM | POA: Diagnosis not present

## 2023-10-10 DIAGNOSIS — Z89422 Acquired absence of other left toe(s): Secondary | ICD-10-CM | POA: Diagnosis not present

## 2023-10-10 DIAGNOSIS — E1142 Type 2 diabetes mellitus with diabetic polyneuropathy: Secondary | ICD-10-CM | POA: Diagnosis not present

## 2023-10-10 DIAGNOSIS — E11610 Type 2 diabetes mellitus with diabetic neuropathic arthropathy: Secondary | ICD-10-CM | POA: Diagnosis not present

## 2023-10-10 DIAGNOSIS — E1165 Type 2 diabetes mellitus with hyperglycemia: Secondary | ICD-10-CM | POA: Diagnosis not present

## 2023-10-11 DIAGNOSIS — E1165 Type 2 diabetes mellitus with hyperglycemia: Secondary | ICD-10-CM | POA: Diagnosis not present

## 2023-10-20 ENCOUNTER — Other Ambulatory Visit: Payer: Self-pay | Admitting: Cardiology

## 2024-01-01 ENCOUNTER — Other Ambulatory Visit: Payer: Self-pay | Admitting: Cardiology

## 2024-01-02 ENCOUNTER — Other Ambulatory Visit: Payer: Self-pay | Admitting: Cardiology

## 2024-01-02 DIAGNOSIS — I4891 Unspecified atrial fibrillation: Secondary | ICD-10-CM

## 2024-01-02 NOTE — Telephone Encounter (Signed)
 Prescription refill request for Eliquis received. Indication: Afib  Last office visit: 06/18/23 (Revankar)  Scr: 1.18 (06/19/23)  Age: 73 Weight: 84kg  Appropriate dose. Refill sent.

## 2024-04-05 ENCOUNTER — Other Ambulatory Visit: Payer: Self-pay | Admitting: Cardiology

## 2024-05-11 ENCOUNTER — Other Ambulatory Visit: Payer: Self-pay | Admitting: Cardiology

## 2024-06-03 ENCOUNTER — Encounter: Payer: Self-pay | Admitting: Cardiology

## 2024-06-03 ENCOUNTER — Ambulatory Visit: Attending: Cardiology | Admitting: Cardiology

## 2024-06-03 VITALS — BP 138/70 | HR 66 | Ht 70.0 in | Wt 187.4 lb

## 2024-06-03 DIAGNOSIS — I255 Ischemic cardiomyopathy: Secondary | ICD-10-CM

## 2024-06-03 DIAGNOSIS — Z9889 Other specified postprocedural states: Secondary | ICD-10-CM | POA: Diagnosis not present

## 2024-06-03 DIAGNOSIS — E1142 Type 2 diabetes mellitus with diabetic polyneuropathy: Secondary | ICD-10-CM

## 2024-06-03 DIAGNOSIS — I2510 Atherosclerotic heart disease of native coronary artery without angina pectoris: Secondary | ICD-10-CM | POA: Diagnosis not present

## 2024-06-03 DIAGNOSIS — I10 Essential (primary) hypertension: Secondary | ICD-10-CM

## 2024-06-03 DIAGNOSIS — Z8679 Personal history of other diseases of the circulatory system: Secondary | ICD-10-CM

## 2024-06-03 DIAGNOSIS — E782 Mixed hyperlipidemia: Secondary | ICD-10-CM

## 2024-06-03 DIAGNOSIS — E559 Vitamin D deficiency, unspecified: Secondary | ICD-10-CM

## 2024-06-03 NOTE — Progress Notes (Signed)
 Cardiology Office Note:    Date:  06/03/2024   ID:  Joseph [name], DOB 26-Apr-1951, MRN [id number]  PCP:  Dottie Norleen PHEBE PONCE, MD  Cardiologist:  Jennifer JONELLE Crape, MD   Referring MD: Dottie Norleen PHEBE PONCE, MD    ASSESSMENT:    1. Benign essential hypertension   2. Coronary artery disease involving native coronary artery of native heart without angina pectoris   3. Essential hypertension   4. Diabetic peripheral neuropathy (HCC)   5. S/P ablation of atrial fibrillation   6. Mixed hyperlipidemia    PLAN:    In order of problems listed above:  Secondary prevention stressed with the patient.  Importance of compliance with diet medication stressed and patient verbalized standing. Paroxysmal atrial fibrillation:I discussed with the patient atrial fibrillation, disease process. Management and therapy including rate and rhythm control, anticoagulation benefits and potential risks were discussed extensively with the patient. Patient had multiple questions which were answered to patient's satisfaction. Mixed dyslipidemia: Lipid-lowering medications followed by primary care.  He has not had blood work for some time so he will come back for complete blood work in the next few days. Essential hypertension: Blood pressure is stable and diet was emphasized. Coronary artery disease: Stable and asymptomatic and we will continue to monitor.  Secondary prevention stressed. History of cardiomyopathy: Normal ejection fraction at this time and is happy about. Diabetes mellitus: Diet emphasized.  He promises to do better.  Will do hemoglobin A1c and forward to primary care. Patient will be seen in follow-up appointment in 6 months or earlier if the patient has any concerns.    Medication Adjustments/Labs and Tests Ordered: Current medicines are reviewed at length with the patient today.  Concerns regarding medicines are outlined above.  No orders of the defined types were placed in this encounter.  No  orders of the defined types were placed in this encounter.    No chief complaint on file.    History of Present Illness:    Joseph [name] is a 73 y.o. male.  Patient has past medical history of coronary artery disease, essential hypertension, mixed dyslipidemia, diabetes mellitus and neuropathy.  He has history of cardiomyopathy with reversal of ejection fraction to normal.  He denies any chest pain orthopnea or PND.  At the time of my evaluation, the patient is alert awake oriented and in no distress.  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) 06/25/2022   Atrial fibrillation (HCC)    Benign essential hypertension 01/09/2016   CAD (coronary artery disease)    Cardiomyopathy (HCC) 11/28/2021   Coronary artery disease 05/10/2015   Diabetes mellitus (HCC)    Diabetic Charcot foot (HCC) 01/09/2016   Diabetic infection of left foot (HCC) 05/17/2020   Diabetic peripheral neuropathy (HCC) 11/27/2019   Diabetic ulcer of left midfoot associated with diabetes mellitus due to underlying condition, with fat layer exposed (HCC) 09/10/2017   Diabetic ulcer of right midfoot associated with type 2 diabetes mellitus, with fat layer exposed (HCC) 12/10/2019   Essential hypertension    Hammertoes of both feet 11/18/2018   History of diabetic ulcer of foot 09/03/2021   Hyperlipidemia 01/09/2016   Infected blister of toe of right foot 01/05/2018   Insulin long-term use (HCC) 01/09/2016   Moderate mitral regurgitation 03/25/2018   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   Pre-ulcerative calluses 07/08/2018   Pure hypercholesterolemia    S/P ablation of atrial fibrillation 10/08/2018   Status post amputation  of left great toe (HCC) 01/31/2016   Thyroid  nodule 04/10/2016   Toenail fungus 11/17/2017   Type 2 diabetes, uncontrolled, with neuropathy 05/01/2016    Past Surgical History:  Procedure Laterality Date   ABLATION OF DYSRHYTHMIC FOCUS     ATRIAL FIBRILLATION  ABLATION N/A 09/24/2018   Procedure: ATRIAL FIBRILLATION ABLATION;  Surgeon: Inocencio Soyla Lunger, MD;  Location: [facility];  Service: Cardiovascular;  Laterality: N/A;   big toe removed     cardiac catheterization     CORONARY STENT PLACEMENT     tonsillectomy      Current Medications: Current Meds  Medication Sig   acetaminophen  (TYLENOL ) 500 MG tablet Take 500 mg by mouth every 4 (four) hours as needed for headache.   apixaban  (ELIQUIS ) 5 MG TABS tablet Take 1 tablet by mouth twice daily   aspirin EC 81 MG tablet Take 81 mg by mouth daily.   atorvastatin  (LIPITOR) 40 MG tablet Take 1 tablet (40 mg total) by mouth daily.   Cyanocobalamin 2000 MCG TBCR Take 1,000 mcg by mouth 2 (two) times daily.   gabapentin (NEURONTIN) 300 MG capsule Take 300 mg by mouth at bedtime.    Glucagon 3 MG/DOSE POWD Place 1 spray into the nose as needed for low blood sugar.   LEVEMIR 100 UNIT/ML injection Inject 11 Units into the skin at bedtime.   metoprolol  succinate (TOPROL -XL) 100 MG 24 hr tablet Take 1 tablet by mouth once daily   NOVOLOG FLEXPEN RELION 100 UNIT/ML FlexPen Inject 12-15 Units into the skin with breakfast, with lunch, and with evening meal. Based on sliding scale   sacubitril-valsartan (ENTRESTO ) 49-51 MG Take 1 tablet by mouth twice daily     Allergies:   Patient has no known allergies.   Social History   Socioeconomic History   Marital status: Unknown    Spouse name: Not on file   Number of children: Not on file   Years of education: Not on file   Highest education level: Not on file  Occupational History   Not on file  Tobacco Use   Smoking status: Former   Smokeless tobacco: Never  Vaping Use   Vaping status: Never Used  Substance and Sexual Activity   Alcohol use: Yes   Drug use: No   Sexual activity: Not on file  Other Topics Concern   Not on file  Social History Narrative   Not on file   Social Drivers of Health   Financial Resource Strain: Not on  file  Food Insecurity: Low Risk  (02/09/2024)   Received from [facility]   Hunger Vital Sign    Within the past 12 months, you worried that your food would run out before you got money to buy more: Never true    Within the past 12 months, the food you bought just didn't last and you didn't have money to get more. : Never true  Transportation Needs: No Transportation Needs (02/09/2024)   Received from [facility]    In the past 12 months, has lack of reliable transportation kept you from medical appointments, meetings, work or from getting things needed for daily living? : No  Physical Activity: Not on file  Stress: Not on file  Social Connections: Unknown (02/26/2022)   [facility]   Social Network    Social Network: Not on file     Family History: The patient's family history includes Cancer in his father; Heart disease  in his mother.  ROS:   Please see the history of present illness.    All other systems reviewed and are negative.  EKGs/Labs/Other Studies Reviewed:    The following studies were reviewed today: .SABRA      Recent Labs: 06/19/2023: ALT 15; BUN 20; Creatinine, Ser 1.18; Hemoglobin 14.1; Platelets 133; Potassium 5.0; Sodium 141; TSH 2.560  Recent Lipid Panel    Component Value Date/Time   CHOL 134 06/19/2023 0847   TRIG 100 06/19/2023 0847   HDL 49 06/19/2023 0847   CHOLHDL 2.7 06/19/2023 0847   LDLCALC 66 06/19/2023 0847    Physical Exam:    VS:  BP 138/70   Pulse 66   Ht 5' 10 (1.778 m)   Wt 187 lb 6.4 oz (85 kg)   SpO2 94%   BMI 26.89 kg/m     Wt Readings from Last 3 Encounters:  06/03/24 187 lb 6.4 oz (85 kg)  06/18/23 185 lb 3.2 oz (84 kg)  05/20/22 194 lb (88 kg)     GEN: Patient is in no acute distress HEENT: Normal NECK: No JVD; No carotid bruits LYMPHATICS: No lymphadenopathy CARDIAC: Hear sounds regular, 2/6 systolic murmur at the apex. RESPIRATORY:  Clear to auscultation without rales, wheezing or  rhonchi  ABDOMEN: Soft, non-tender, non-distended MUSCULOSKELETAL:  No edema; No deformity  SKIN: Warm and dry NEUROLOGIC:  Alert and oriented x 3 PSYCHIATRIC:  Normal affect   Signed, Jennifer JONELLE Crape, MD  06/03/2024 1:50 PM    [facility]

## 2024-06-03 NOTE — Patient Instructions (Signed)
 Medication Instructions:  Your physician recommends that you continue on your current medications as directed. Please refer to the Current Medication list given to you today.  *If you need a refill on your cardiac medications before your next appointment, please call your pharmacy*   Lab Work: Your physician recommends that you return for lab work in: the next few days for CMP, CBC, TSH, D-dimer, A1C, vitamin D  and fasting lipids You need to have labs done when you are fasting.  You can come Monday through Friday 8:30 am to 12:00 pm and 1:15 to 4:30. You do not need to make an appointment as the order has already been placed.  If you have labs (blood work) drawn today and your tests are completely normal, you will receive your results only by: MyChart Message (if you have MyChart) OR A paper copy in the mail If you have any lab test that is abnormal or we need to change your treatment, we will call you to review the results.   Testing/Procedures: None ordered   Follow-Up: At [facility], you and your health needs are our priority.  As part of our continuing mission to provide you with exceptional heart care, we have created designated Provider Care Teams.  These Care Teams include your primary Cardiologist (physician) and Advanced Practice Providers (APPs -  Physician Assistants and Nurse Practitioners) who all work together to provide you with the care you need, when you need it.  We recommend signing up for the patient portal called MyChart.  Sign up information is provided on this After Visit Summary.  MyChart is used to connect with patients for Virtual Visits (Telemedicine).  Patients are able to view lab/test results, encounter notes, upcoming appointments, etc.  Non-urgent messages can be sent to your provider as well.   To learn more about what you can do with MyChart, go to [email or web address].    Your next appointment:   9 month(s)  The format for your next  appointment:   In Person  Provider:   Jennifer Crape, MD    Other Instructions none  Important Information About Sugar

## 2024-06-03 NOTE — Addendum Note (Signed)
 Addended by: ONEITA BERLINER on: 06/03/2024 02:05 PM   Modules accepted: Orders

## 2024-06-05 LAB — HEMOGLOBIN A1C
Est. average glucose Bld gHb Est-mCnc: 229 mg/dL
Hgb A1c MFr Bld: 9.6 % — ABNORMAL HIGH (ref 4.8–5.6)

## 2024-06-05 LAB — CBC
Hematocrit: 44.4 % (ref 37.5–51.0)
Hemoglobin: 14.4 g/dL (ref 13.0–17.7)
MCH: 31.3 pg (ref 26.6–33.0)
MCHC: 32.4 g/dL (ref 31.5–35.7)
MCV: 97 fL (ref 79–97)
Platelets: 149 x10E3/uL — ABNORMAL LOW (ref 150–450)
RBC: 4.6 x10E6/uL (ref 4.14–5.80)
RDW: 12.4 % (ref 11.6–15.4)
WBC: 5.1 x10E3/uL (ref 3.4–10.8)

## 2024-06-05 LAB — COMPREHENSIVE METABOLIC PANEL WITH GFR
ALT: 12 IU/L (ref 0–44)
AST: 15 IU/L (ref 0–40)
Albumin: 3.9 g/dL (ref 3.8–4.8)
Alkaline Phosphatase: 129 IU/L — ABNORMAL HIGH (ref 44–121)
BUN/Creatinine Ratio: 18 (ref 10–24)
BUN: 24 mg/dL (ref 8–27)
Bilirubin Total: 0.8 mg/dL (ref 0.0–1.2)
CO2: 26 mmol/L (ref 20–29)
Calcium: 9.8 mg/dL (ref 8.6–10.2)
Chloride: 104 mmol/L (ref 96–106)
Creatinine, Ser: 1.33 mg/dL — ABNORMAL HIGH (ref 0.76–1.27)
Globulin, Total: 2.1 g/dL (ref 1.5–4.5)
Glucose: 172 mg/dL — ABNORMAL HIGH (ref 70–99)
Potassium: 5 mmol/L (ref 3.5–5.2)
Sodium: 142 mmol/L (ref 134–144)
Total Protein: 6 g/dL (ref 6.0–8.5)
eGFR: 57 mL/min/1.73 — ABNORMAL LOW (ref >59)

## 2024-06-05 LAB — VITAMIN D 25 HYDROXY (VIT D DEFICIENCY, FRACTURES): Vit D, 25-Hydroxy: 28.9 ng/mL — ABNORMAL LOW (ref 30.0–100.0)

## 2024-06-05 LAB — LIPID PANEL
Chol/HDL Ratio: 3.3 ratio (ref 0.0–5.0)
Cholesterol, Total: 141 mg/dL (ref 100–199)
HDL: 43 mg/dL (ref >39)
LDL Chol Calc (NIH): 78 mg/dL (ref 0–99)
Triglycerides: 110 mg/dL (ref 0–149)
VLDL Cholesterol Cal: 20 mg/dL (ref 5–40)

## 2024-06-05 LAB — D-DIMER, QUANTITATIVE: D-DIMER: 0.6 mg{FEU}/L — ABNORMAL HIGH (ref 0.00–0.49)

## 2024-06-05 LAB — TSH: TSH: 2.21 u[IU]/mL (ref 0.450–4.500)

## 2024-06-09 ENCOUNTER — Other Ambulatory Visit: Payer: Self-pay | Admitting: Cardiology

## 2024-06-09 ENCOUNTER — Ambulatory Visit: Payer: Self-pay | Admitting: Cardiology

## 2024-06-09 DIAGNOSIS — E782 Mixed hyperlipidemia: Secondary | ICD-10-CM

## 2024-06-09 DIAGNOSIS — I2510 Atherosclerotic heart disease of native coronary artery without angina pectoris: Secondary | ICD-10-CM

## 2024-06-09 DIAGNOSIS — R7989 Other specified abnormal findings of blood chemistry: Secondary | ICD-10-CM

## 2024-06-10 ENCOUNTER — Other Ambulatory Visit: Payer: Self-pay | Admitting: Cardiology

## 2024-06-16 NOTE — Telephone Encounter (Signed)
Results reviewed with pt as per Dr. Revankar's note.  Pt verbalized understanding and had no additional questions. Routed to PCP.  

## 2024-06-16 NOTE — Telephone Encounter (Signed)
-----   Message from Jennifer JONELLE Crape sent at 06/09/2024 12:04 PM EDT ----- I am concerned about his D-dimer.  I am aware that he is on anticoagulation.  Please schedule him for a stat VQ scan to rule out PE.  Copy primary he needs to talk to his primary care about markedly  uncontrolled diabetes and other issues.  Jennifer JONELLE Crape, MD 06/09/2024 12:04 PM  ----- Message ----- From: Rebecka Memos Lab Results In Sent: 06/05/2024   7:38 AM EDT To: Jennifer JONELLE Crape, MD

## 2024-07-05 ENCOUNTER — Other Ambulatory Visit: Payer: Self-pay | Admitting: Cardiology

## 2024-08-02 ENCOUNTER — Encounter: Payer: Self-pay | Admitting: Cardiology

## 2024-08-21 ENCOUNTER — Other Ambulatory Visit: Payer: Self-pay | Admitting: Cardiology

## 2024-08-21 DIAGNOSIS — I4891 Unspecified atrial fibrillation: Secondary | ICD-10-CM

## 2024-08-23 NOTE — Telephone Encounter (Signed)
 Eliquis  5mg  refill request received. Patient is 73 years old, weight-85kg, Crea-1.33 on 06/04/24, Diagnosis-Afib, and last seen by Dr. Edwyna on 06/03/24. Dose is appropriate based on dosing criteria. Will send in refill to requested pharmacy.
